# Patient Record
Sex: Male | Born: 1945 | Race: White | Hispanic: No | Smoking: Current every day smoker
Health system: Southern US, Community
[De-identification: ages and names within clinical notes are randomized; demographics above are authoritative.]

## PROBLEM LIST (undated history)

## (undated) DIAGNOSIS — I10 Essential (primary) hypertension: Secondary | ICD-10-CM

## (undated) DIAGNOSIS — J449 Chronic obstructive pulmonary disease, unspecified: Secondary | ICD-10-CM

## (undated) DIAGNOSIS — N529 Male erectile dysfunction, unspecified: Secondary | ICD-10-CM

## (undated) DIAGNOSIS — C61 Malignant neoplasm of prostate: Secondary | ICD-10-CM

## (undated) DIAGNOSIS — F329 Major depressive disorder, single episode, unspecified: Secondary | ICD-10-CM

## (undated) DIAGNOSIS — E119 Type 2 diabetes mellitus without complications: Secondary | ICD-10-CM

## (undated) DIAGNOSIS — F32A Depression, unspecified: Secondary | ICD-10-CM

## (undated) HISTORY — DX: Male erectile dysfunction, unspecified: N52.9

## (undated) HISTORY — DX: Major depressive disorder, single episode, unspecified: F32.9

## (undated) HISTORY — DX: Type 2 diabetes mellitus without complications: E11.9

## (undated) HISTORY — PX: BACK SURGERY: SHX140

## (undated) HISTORY — DX: Essential (primary) hypertension: I10

## (undated) HISTORY — PX: APPENDECTOMY: SHX54

## (undated) HISTORY — DX: Depression, unspecified: F32.A

## (undated) HISTORY — PX: COLONOSCOPY: SHX174

---

## 2008-10-10 ENCOUNTER — Encounter: Admission: RE | Admit: 2008-10-10 | Discharge: 2008-10-10 | Payer: Self-pay | Admitting: Family Medicine

## 2010-04-03 ENCOUNTER — Inpatient Hospital Stay (HOSPITAL_COMMUNITY)
Admission: EM | Admit: 2010-04-03 | Discharge: 2010-04-10 | Disposition: A | Discharge status: Rehabilitation Facility | Payer: Self-pay | Source: Home / Self Care | Admitting: Emergency Medicine

## 2010-04-05 ENCOUNTER — Ambulatory Visit: Payer: Self-pay | Admitting: Physical Medicine & Rehabilitation

## 2010-04-10 ENCOUNTER — Inpatient Hospital Stay (HOSPITAL_COMMUNITY)
Admission: RE | Admit: 2010-04-10 | Discharge: 2010-04-19 | Payer: Self-pay | Admitting: Physical Medicine & Rehabilitation

## 2010-06-06 ENCOUNTER — Encounter
Admission: RE | Admit: 2010-06-06 | Discharge: 2010-06-06 | Payer: Self-pay | Admitting: Physical Medicine & Rehabilitation

## 2010-07-12 ENCOUNTER — Encounter: Admission: RE | Admit: 2010-07-12 | Discharge: 2010-07-12 | Payer: Self-pay | Admitting: Neurological Surgery

## 2011-01-28 LAB — ABO/RH: ABO/RH(D): B NEG

## 2011-01-28 LAB — MRSA PCR SCREENING: MRSA by PCR: NEGATIVE

## 2011-01-28 LAB — DIFFERENTIAL
Basophils Absolute: 0 10*3/uL (ref 0.0–0.1)
Eosinophils Absolute: 0.2 10*3/uL (ref 0.0–0.7)
Eosinophils Relative: 2 % (ref 0–5)
Lymphocytes Relative: 9 % — ABNORMAL LOW (ref 12–46)
Lymphs Abs: 3 10*3/uL (ref 0.7–4.0)
Lymphs Abs: 1.3 10*3/uL (ref 0.7–4.0)
Monocytes Absolute: 0.4 10*3/uL (ref 0.1–1.0)
Monocytes Relative: 10 % (ref 3–12)
Monocytes Relative: 3 % (ref 3–12)
Neutro Abs: 12.8 10*3/uL — ABNORMAL HIGH (ref 1.7–7.7)
Neutrophils Relative %: 88 % — ABNORMAL HIGH (ref 43–77)

## 2011-01-28 LAB — GLUCOSE, CAPILLARY
Glucose-Capillary: 101 mg/dL — ABNORMAL HIGH (ref 70–99)
Glucose-Capillary: 107 mg/dL — ABNORMAL HIGH (ref 70–99)
Glucose-Capillary: 108 mg/dL — ABNORMAL HIGH (ref 70–99)
Glucose-Capillary: 109 mg/dL — ABNORMAL HIGH (ref 70–99)
Glucose-Capillary: 114 mg/dL — ABNORMAL HIGH (ref 70–99)
Glucose-Capillary: 117 mg/dL — ABNORMAL HIGH (ref 70–99)
Glucose-Capillary: 120 mg/dL — ABNORMAL HIGH (ref 70–99)
Glucose-Capillary: 123 mg/dL — ABNORMAL HIGH (ref 70–99)
Glucose-Capillary: 128 mg/dL — ABNORMAL HIGH (ref 70–99)
Glucose-Capillary: 129 mg/dL — ABNORMAL HIGH (ref 70–99)
Glucose-Capillary: 142 mg/dL — ABNORMAL HIGH (ref 70–99)
Glucose-Capillary: 145 mg/dL — ABNORMAL HIGH (ref 70–99)
Glucose-Capillary: 196 mg/dL — ABNORMAL HIGH (ref 70–99)
Glucose-Capillary: 87 mg/dL (ref 70–99)
Glucose-Capillary: 90 mg/dL (ref 70–99)
Glucose-Capillary: 90 mg/dL (ref 70–99)
Glucose-Capillary: 92 mg/dL (ref 70–99)
Glucose-Capillary: 96 mg/dL (ref 70–99)
Glucose-Capillary: 113 mg/dL — ABNORMAL HIGH (ref 70–99)
Glucose-Capillary: 115 mg/dL — ABNORMAL HIGH (ref 70–99)
Glucose-Capillary: 130 mg/dL — ABNORMAL HIGH (ref 70–99)
Glucose-Capillary: 135 mg/dL — ABNORMAL HIGH (ref 70–99)
Glucose-Capillary: 139 mg/dL — ABNORMAL HIGH (ref 70–99)
Glucose-Capillary: 143 mg/dL — ABNORMAL HIGH (ref 70–99)
Glucose-Capillary: 158 mg/dL — ABNORMAL HIGH (ref 70–99)

## 2011-01-28 LAB — URINE CULTURE
Colony Count: NO GROWTH
Culture: NO GROWTH

## 2011-01-28 LAB — POCT I-STAT, CHEM 8
Chloride: 106 mEq/L (ref 96–112)
Glucose, Bld: 123 mg/dL — ABNORMAL HIGH (ref 70–99)
HCT: 48 % (ref 39.0–52.0)
Hemoglobin: 16.3 g/dL (ref 13.0–17.0)
Potassium: 4.3 mEq/L (ref 3.5–5.1)
Sodium: 137 mEq/L (ref 135–145)

## 2011-01-28 LAB — URINALYSIS, ROUTINE W REFLEX MICROSCOPIC
Bilirubin Urine: NEGATIVE
Bilirubin Urine: NEGATIVE
Ketones, ur: NEGATIVE mg/dL
Leukocytes, UA: NEGATIVE
Nitrite: NEGATIVE
Nitrite: NEGATIVE
Specific Gravity, Urine: 1.015 (ref 1.005–1.030)
Specific Gravity, Urine: 1.017 (ref 1.005–1.030)
Urobilinogen, UA: 0.2 mg/dL (ref 0.0–1.0)

## 2011-01-28 LAB — URINE MICROSCOPIC-ADD ON

## 2011-01-28 LAB — CBC
HCT: 43.3 % (ref 39.0–52.0)
Hemoglobin: 15.6 g/dL (ref 13.0–17.0)
MCHC: 33.7 g/dL (ref 30.0–36.0)
RBC: 4.68 MIL/uL (ref 4.22–5.81)
RBC: 4.95 MIL/uL (ref 4.22–5.81)
RDW: 14.4 % (ref 11.5–15.5)
WBC: 12.5 10*3/uL — ABNORMAL HIGH (ref 4.0–10.5)
WBC: 14.6 10*3/uL — ABNORMAL HIGH (ref 4.0–10.5)

## 2011-01-28 LAB — COMPREHENSIVE METABOLIC PANEL
BUN: 29 mg/dL — ABNORMAL HIGH (ref 6–23)
CO2: 28 mEq/L (ref 19–32)
Calcium: 9.1 mg/dL (ref 8.4–10.5)
Chloride: 101 mEq/L (ref 96–112)
Sodium: 133 mEq/L — ABNORMAL LOW (ref 135–145)
Total Bilirubin: 0.8 mg/dL (ref 0.3–1.2)
Total Protein: 6.3 g/dL (ref 6.0–8.3)

## 2011-01-28 LAB — TYPE AND SCREEN: Antibody Screen: NEGATIVE

## 2012-05-29 DIAGNOSIS — N410 Acute prostatitis: Secondary | ICD-10-CM | POA: Diagnosis not present

## 2012-05-29 DIAGNOSIS — N3000 Acute cystitis without hematuria: Secondary | ICD-10-CM | POA: Diagnosis not present

## 2012-05-29 DIAGNOSIS — N138 Other obstructive and reflux uropathy: Secondary | ICD-10-CM | POA: Diagnosis not present

## 2012-05-29 DIAGNOSIS — N401 Enlarged prostate with lower urinary tract symptoms: Secondary | ICD-10-CM | POA: Diagnosis not present

## 2012-06-02 DIAGNOSIS — N138 Other obstructive and reflux uropathy: Secondary | ICD-10-CM | POA: Diagnosis not present

## 2012-06-02 DIAGNOSIS — R339 Retention of urine, unspecified: Secondary | ICD-10-CM | POA: Diagnosis not present

## 2012-06-02 DIAGNOSIS — N401 Enlarged prostate with lower urinary tract symptoms: Secondary | ICD-10-CM | POA: Diagnosis not present

## 2012-06-29 DIAGNOSIS — E785 Hyperlipidemia, unspecified: Secondary | ICD-10-CM | POA: Diagnosis not present

## 2012-06-29 DIAGNOSIS — F329 Major depressive disorder, single episode, unspecified: Secondary | ICD-10-CM | POA: Diagnosis not present

## 2012-06-29 DIAGNOSIS — E119 Type 2 diabetes mellitus without complications: Secondary | ICD-10-CM | POA: Diagnosis not present

## 2012-06-29 DIAGNOSIS — J449 Chronic obstructive pulmonary disease, unspecified: Secondary | ICD-10-CM | POA: Diagnosis not present

## 2012-06-29 DIAGNOSIS — I10 Essential (primary) hypertension: Secondary | ICD-10-CM | POA: Diagnosis not present

## 2012-06-29 DIAGNOSIS — F3289 Other specified depressive episodes: Secondary | ICD-10-CM | POA: Diagnosis not present

## 2012-07-23 ENCOUNTER — Other Ambulatory Visit: Payer: Self-pay | Admitting: Family Medicine

## 2012-07-23 ENCOUNTER — Ambulatory Visit
Admission: RE | Admit: 2012-07-23 | Discharge: 2012-07-23 | Disposition: A | Discharge status: Home / Self Care | Payer: Medicare Other | Source: Ambulatory Visit | Attending: Family Medicine | Admitting: Family Medicine

## 2012-07-23 DIAGNOSIS — G5600 Carpal tunnel syndrome, unspecified upper limb: Secondary | ICD-10-CM | POA: Diagnosis not present

## 2012-07-23 DIAGNOSIS — M25569 Pain in unspecified knee: Secondary | ICD-10-CM

## 2012-07-23 DIAGNOSIS — I10 Essential (primary) hypertension: Secondary | ICD-10-CM | POA: Diagnosis not present

## 2012-07-23 DIAGNOSIS — Z23 Encounter for immunization: Secondary | ICD-10-CM | POA: Diagnosis not present

## 2012-08-26 DIAGNOSIS — N401 Enlarged prostate with lower urinary tract symptoms: Secondary | ICD-10-CM | POA: Diagnosis not present

## 2012-08-26 DIAGNOSIS — N138 Other obstructive and reflux uropathy: Secondary | ICD-10-CM | POA: Diagnosis not present

## 2012-09-02 DIAGNOSIS — N400 Enlarged prostate without lower urinary tract symptoms: Secondary | ICD-10-CM | POA: Diagnosis not present

## 2012-09-05 DIAGNOSIS — S63509A Unspecified sprain of unspecified wrist, initial encounter: Secondary | ICD-10-CM | POA: Diagnosis not present

## 2012-09-05 DIAGNOSIS — M79609 Pain in unspecified limb: Secondary | ICD-10-CM | POA: Diagnosis not present

## 2012-09-08 DIAGNOSIS — Z0271 Encounter for disability determination: Secondary | ICD-10-CM | POA: Diagnosis not present

## 2012-09-08 DIAGNOSIS — I10 Essential (primary) hypertension: Secondary | ICD-10-CM | POA: Diagnosis not present

## 2012-09-14 DIAGNOSIS — Z23 Encounter for immunization: Secondary | ICD-10-CM | POA: Diagnosis not present

## 2012-09-14 DIAGNOSIS — Z136 Encounter for screening for cardiovascular disorders: Secondary | ICD-10-CM | POA: Diagnosis not present

## 2012-09-14 DIAGNOSIS — Z0000 Encounter for general adult medical examination without abnormal findings: Secondary | ICD-10-CM | POA: Diagnosis not present

## 2012-09-14 DIAGNOSIS — Z131 Encounter for screening for diabetes mellitus: Secondary | ICD-10-CM | POA: Diagnosis not present

## 2012-10-30 DIAGNOSIS — Z87891 Personal history of nicotine dependence: Secondary | ICD-10-CM | POA: Diagnosis not present

## 2013-01-18 DIAGNOSIS — R252 Cramp and spasm: Secondary | ICD-10-CM | POA: Diagnosis not present

## 2013-01-18 DIAGNOSIS — F329 Major depressive disorder, single episode, unspecified: Secondary | ICD-10-CM | POA: Diagnosis not present

## 2013-01-18 DIAGNOSIS — F3289 Other specified depressive episodes: Secondary | ICD-10-CM | POA: Diagnosis not present

## 2013-01-18 DIAGNOSIS — E119 Type 2 diabetes mellitus without complications: Secondary | ICD-10-CM | POA: Diagnosis not present

## 2013-01-18 DIAGNOSIS — I10 Essential (primary) hypertension: Secondary | ICD-10-CM | POA: Diagnosis not present

## 2013-01-18 DIAGNOSIS — J069 Acute upper respiratory infection, unspecified: Secondary | ICD-10-CM | POA: Diagnosis not present

## 2013-01-18 DIAGNOSIS — J449 Chronic obstructive pulmonary disease, unspecified: Secondary | ICD-10-CM | POA: Diagnosis not present

## 2013-02-01 DIAGNOSIS — E119 Type 2 diabetes mellitus without complications: Secondary | ICD-10-CM | POA: Diagnosis not present

## 2013-03-23 IMAGING — CR DG KNEE 1-2V*R*
2 series · 2 of 2 positions shown · non-contrast
Comparison: None.

CLINICAL DATA: Aching

RIGHT KNEE - 1-2 VIEW

[view not recorded (1 of 2)]
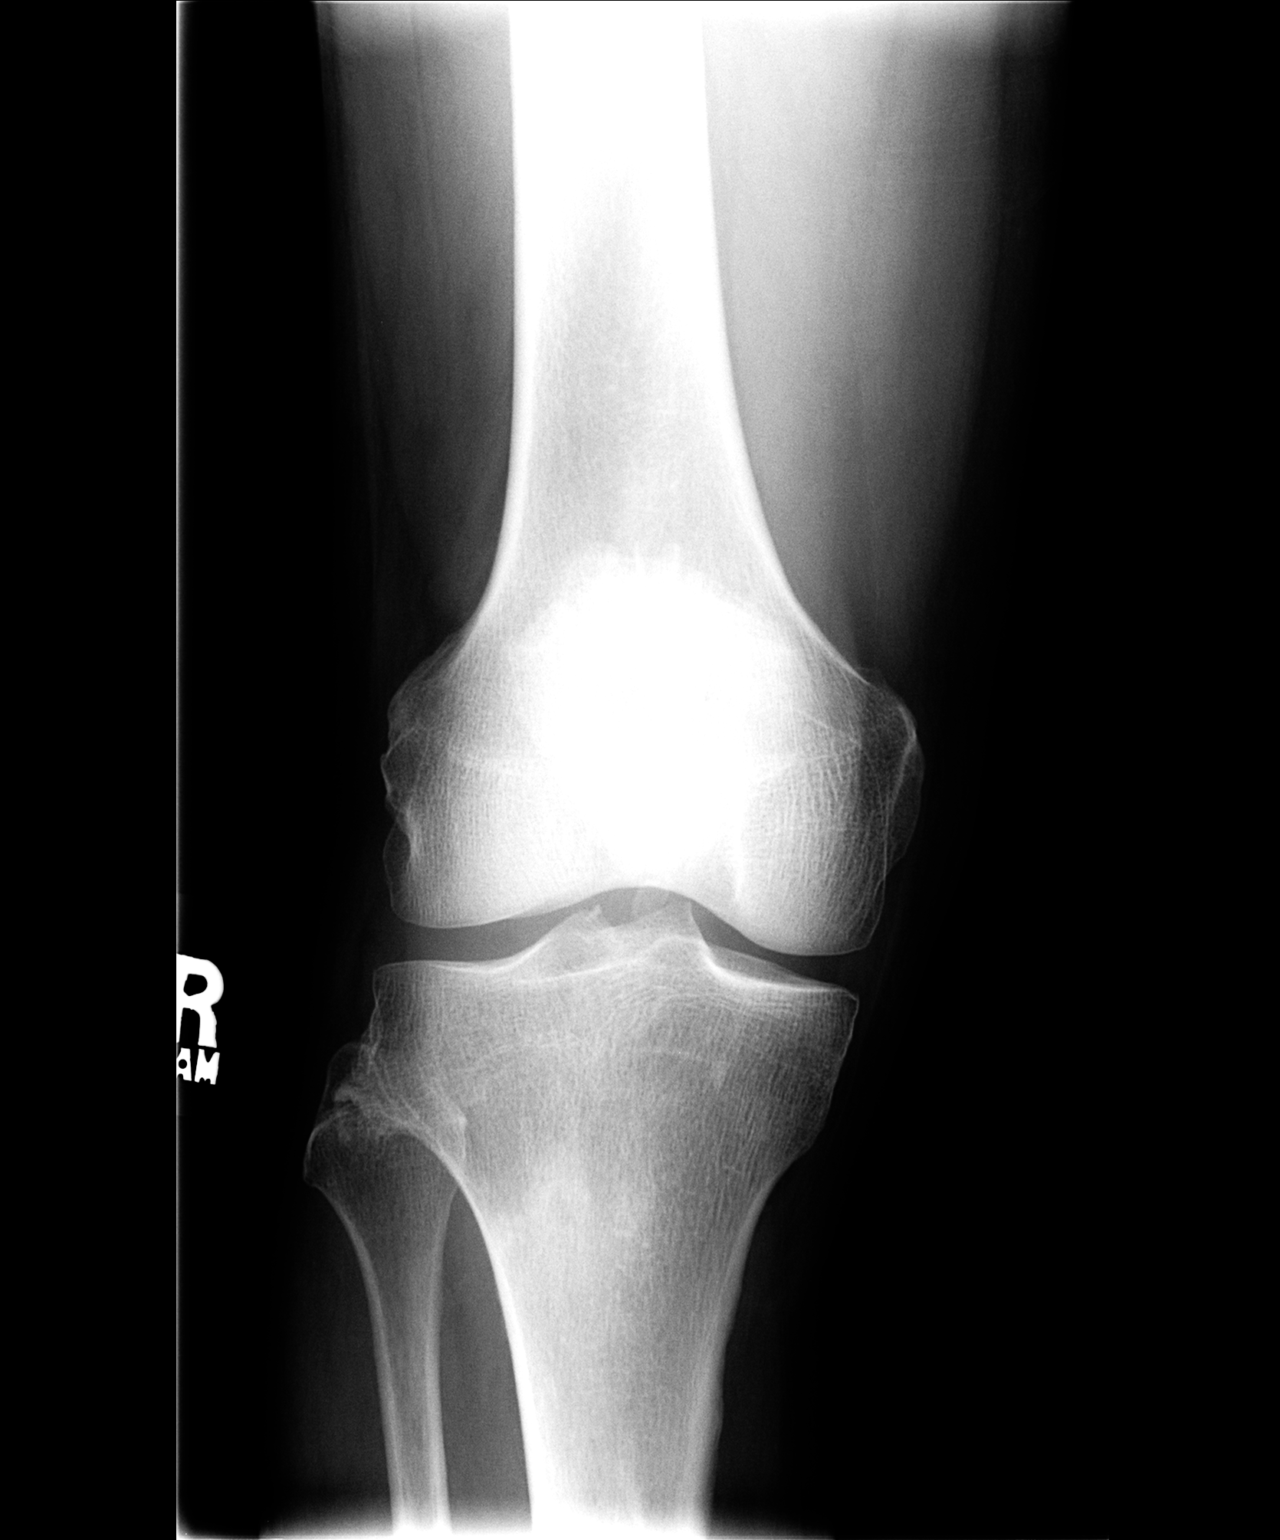

[view not recorded (2 of 2)]
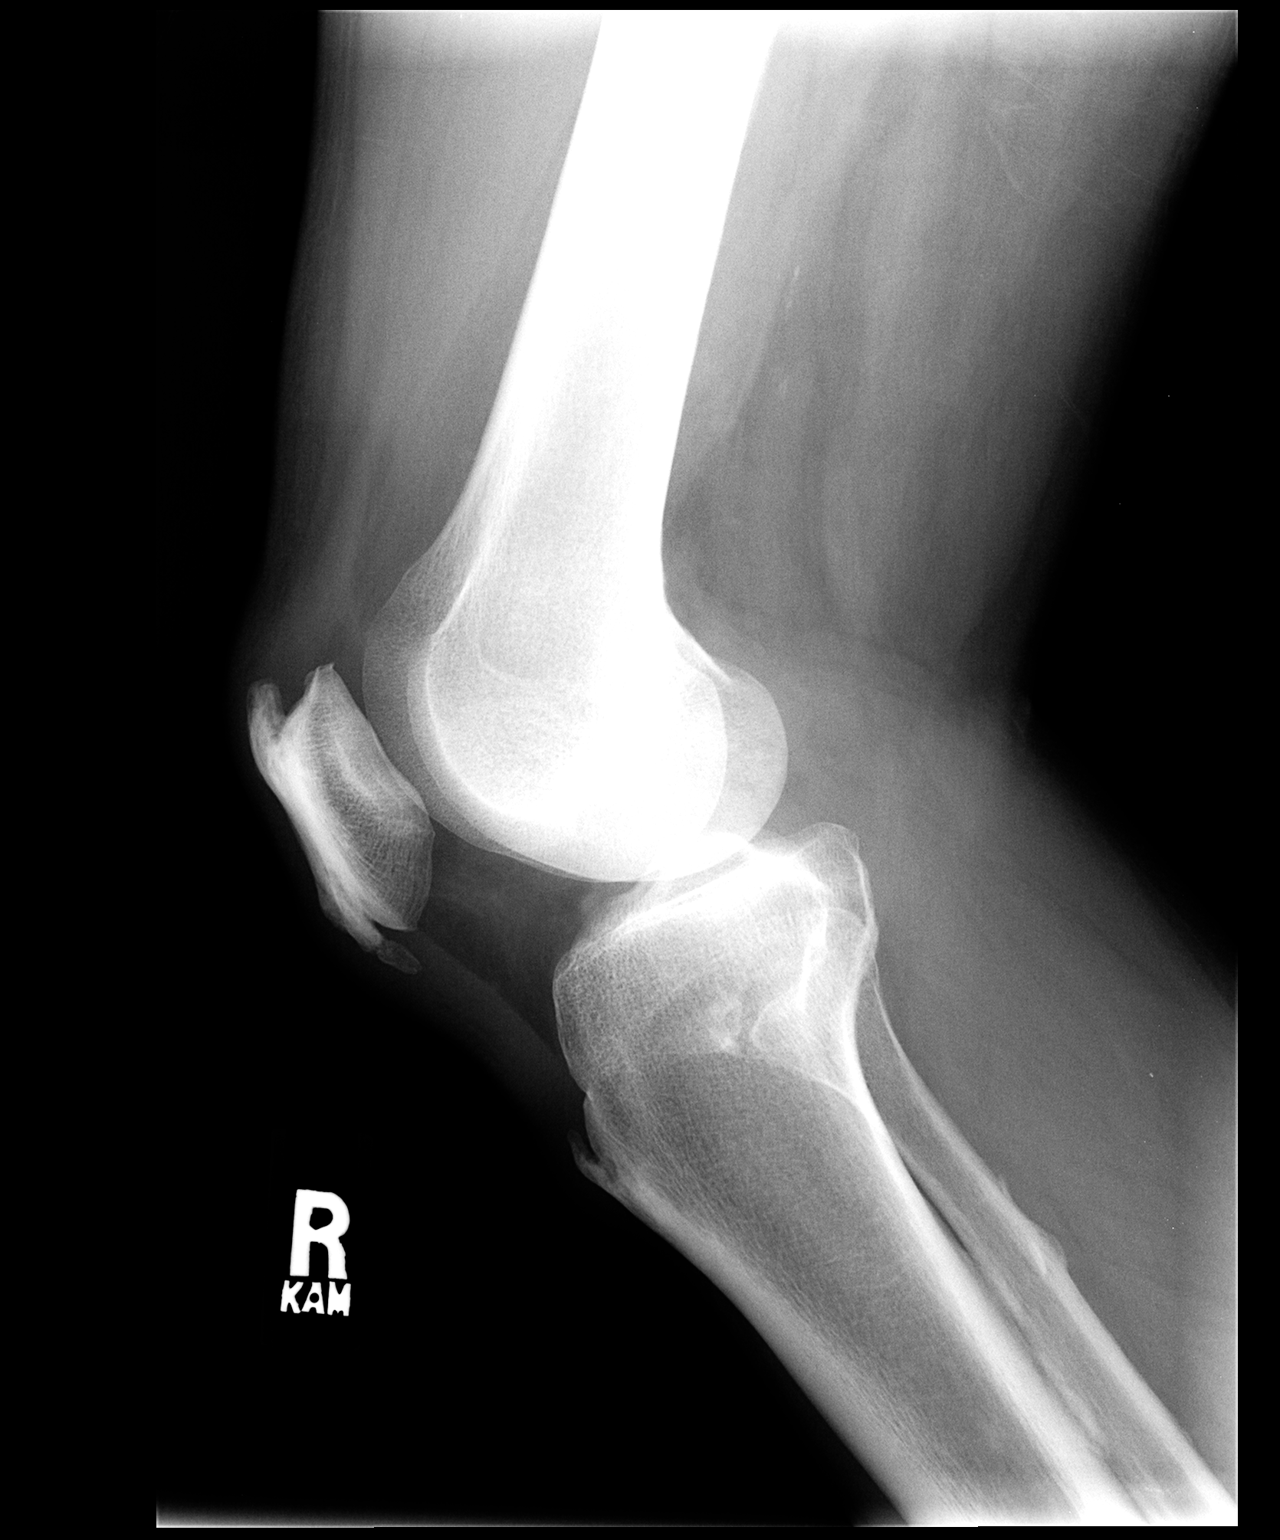

[2 of 2 positions shown; findings below may reference images not displayed]

FINDINGS: Prominent traction spurs from the patella and at the
insertion of the patellar ligament.  No effusion. Negative for
fracture, dislocation, or other acute abnormality.  Normal
alignment and mineralization. No significant degenerative change.
Regional soft tissues unremarkable.
IMPRESSION: Negative

## 2013-04-09 DIAGNOSIS — E119 Type 2 diabetes mellitus without complications: Secondary | ICD-10-CM | POA: Diagnosis not present

## 2013-05-03 DIAGNOSIS — R310 Gross hematuria: Secondary | ICD-10-CM | POA: Diagnosis not present

## 2013-05-11 DIAGNOSIS — R310 Gross hematuria: Secondary | ICD-10-CM | POA: Diagnosis not present

## 2013-05-11 DIAGNOSIS — N400 Enlarged prostate without lower urinary tract symptoms: Secondary | ICD-10-CM | POA: Diagnosis not present

## 2013-05-11 DIAGNOSIS — N320 Bladder-neck obstruction: Secondary | ICD-10-CM | POA: Diagnosis not present

## 2013-05-26 DIAGNOSIS — N400 Enlarged prostate without lower urinary tract symptoms: Secondary | ICD-10-CM | POA: Diagnosis not present

## 2013-05-26 DIAGNOSIS — R310 Gross hematuria: Secondary | ICD-10-CM | POA: Diagnosis not present

## 2013-07-21 DIAGNOSIS — E1159 Type 2 diabetes mellitus with other circulatory complications: Secondary | ICD-10-CM | POA: Diagnosis not present

## 2013-07-21 DIAGNOSIS — F329 Major depressive disorder, single episode, unspecified: Secondary | ICD-10-CM | POA: Diagnosis not present

## 2013-07-21 DIAGNOSIS — Z1211 Encounter for screening for malignant neoplasm of colon: Secondary | ICD-10-CM | POA: Diagnosis not present

## 2013-07-21 DIAGNOSIS — E785 Hyperlipidemia, unspecified: Secondary | ICD-10-CM | POA: Diagnosis not present

## 2013-07-21 DIAGNOSIS — I739 Peripheral vascular disease, unspecified: Secondary | ICD-10-CM | POA: Diagnosis not present

## 2013-07-21 DIAGNOSIS — F17200 Nicotine dependence, unspecified, uncomplicated: Secondary | ICD-10-CM | POA: Diagnosis not present

## 2013-07-21 DIAGNOSIS — I10 Essential (primary) hypertension: Secondary | ICD-10-CM | POA: Diagnosis not present

## 2013-08-26 DIAGNOSIS — N400 Enlarged prostate without lower urinary tract symptoms: Secondary | ICD-10-CM | POA: Diagnosis not present

## 2013-09-09 DIAGNOSIS — Z23 Encounter for immunization: Secondary | ICD-10-CM | POA: Diagnosis not present

## 2013-10-25 DIAGNOSIS — F329 Major depressive disorder, single episode, unspecified: Secondary | ICD-10-CM | POA: Diagnosis not present

## 2014-01-12 DIAGNOSIS — Z23 Encounter for immunization: Secondary | ICD-10-CM | POA: Diagnosis not present

## 2014-01-12 DIAGNOSIS — E1159 Type 2 diabetes mellitus with other circulatory complications: Secondary | ICD-10-CM | POA: Diagnosis not present

## 2014-01-12 DIAGNOSIS — I10 Essential (primary) hypertension: Secondary | ICD-10-CM | POA: Diagnosis not present

## 2014-01-12 DIAGNOSIS — E785 Hyperlipidemia, unspecified: Secondary | ICD-10-CM | POA: Diagnosis not present

## 2014-01-12 DIAGNOSIS — I739 Peripheral vascular disease, unspecified: Secondary | ICD-10-CM | POA: Diagnosis not present

## 2014-01-12 DIAGNOSIS — J449 Chronic obstructive pulmonary disease, unspecified: Secondary | ICD-10-CM | POA: Diagnosis not present

## 2014-01-12 DIAGNOSIS — Z0000 Encounter for general adult medical examination without abnormal findings: Secondary | ICD-10-CM | POA: Diagnosis not present

## 2014-07-14 DIAGNOSIS — N139 Obstructive and reflux uropathy, unspecified: Secondary | ICD-10-CM | POA: Diagnosis not present

## 2014-07-14 DIAGNOSIS — R310 Gross hematuria: Secondary | ICD-10-CM | POA: Diagnosis not present

## 2014-07-14 DIAGNOSIS — N401 Enlarged prostate with lower urinary tract symptoms: Secondary | ICD-10-CM | POA: Diagnosis not present

## 2014-07-14 DIAGNOSIS — N138 Other obstructive and reflux uropathy: Secondary | ICD-10-CM | POA: Diagnosis not present

## 2014-07-19 DIAGNOSIS — I739 Peripheral vascular disease, unspecified: Secondary | ICD-10-CM | POA: Diagnosis not present

## 2014-07-19 DIAGNOSIS — J449 Chronic obstructive pulmonary disease, unspecified: Secondary | ICD-10-CM | POA: Diagnosis not present

## 2014-07-19 DIAGNOSIS — F17200 Nicotine dependence, unspecified, uncomplicated: Secondary | ICD-10-CM | POA: Diagnosis not present

## 2014-07-19 DIAGNOSIS — E785 Hyperlipidemia, unspecified: Secondary | ICD-10-CM | POA: Diagnosis not present

## 2014-07-19 DIAGNOSIS — Z23 Encounter for immunization: Secondary | ICD-10-CM | POA: Diagnosis not present

## 2014-07-19 DIAGNOSIS — I10 Essential (primary) hypertension: Secondary | ICD-10-CM | POA: Diagnosis not present

## 2014-07-19 DIAGNOSIS — E1159 Type 2 diabetes mellitus with other circulatory complications: Secondary | ICD-10-CM | POA: Diagnosis not present

## 2014-09-22 DIAGNOSIS — R202 Paresthesia of skin: Secondary | ICD-10-CM | POA: Diagnosis not present

## 2015-01-17 DIAGNOSIS — M25552 Pain in left hip: Secondary | ICD-10-CM | POA: Diagnosis not present

## 2015-01-17 DIAGNOSIS — J449 Chronic obstructive pulmonary disease, unspecified: Secondary | ICD-10-CM | POA: Diagnosis not present

## 2015-01-17 DIAGNOSIS — I739 Peripheral vascular disease, unspecified: Secondary | ICD-10-CM | POA: Diagnosis not present

## 2015-01-17 DIAGNOSIS — E1151 Type 2 diabetes mellitus with diabetic peripheral angiopathy without gangrene: Secondary | ICD-10-CM | POA: Diagnosis not present

## 2015-01-17 DIAGNOSIS — I10 Essential (primary) hypertension: Secondary | ICD-10-CM | POA: Diagnosis not present

## 2015-01-17 DIAGNOSIS — Z79899 Other long term (current) drug therapy: Secondary | ICD-10-CM | POA: Diagnosis not present

## 2015-01-17 DIAGNOSIS — E785 Hyperlipidemia, unspecified: Secondary | ICD-10-CM | POA: Diagnosis not present

## 2015-01-17 DIAGNOSIS — Z0001 Encounter for general adult medical examination with abnormal findings: Secondary | ICD-10-CM | POA: Diagnosis not present

## 2015-01-23 DIAGNOSIS — Z1211 Encounter for screening for malignant neoplasm of colon: Secondary | ICD-10-CM | POA: Diagnosis not present

## 2015-01-30 DIAGNOSIS — M25552 Pain in left hip: Secondary | ICD-10-CM | POA: Diagnosis not present

## 2015-01-30 DIAGNOSIS — M7062 Trochanteric bursitis, left hip: Secondary | ICD-10-CM | POA: Diagnosis not present

## 2015-03-13 DIAGNOSIS — M7062 Trochanteric bursitis, left hip: Secondary | ICD-10-CM | POA: Diagnosis not present

## 2015-03-23 DIAGNOSIS — M7062 Trochanteric bursitis, left hip: Secondary | ICD-10-CM | POA: Diagnosis not present

## 2015-03-28 DIAGNOSIS — M7062 Trochanteric bursitis, left hip: Secondary | ICD-10-CM | POA: Diagnosis not present

## 2015-03-30 DIAGNOSIS — M7062 Trochanteric bursitis, left hip: Secondary | ICD-10-CM | POA: Diagnosis not present

## 2015-04-04 DIAGNOSIS — M7062 Trochanteric bursitis, left hip: Secondary | ICD-10-CM | POA: Diagnosis not present

## 2015-04-06 DIAGNOSIS — M7062 Trochanteric bursitis, left hip: Secondary | ICD-10-CM | POA: Diagnosis not present

## 2015-04-11 DIAGNOSIS — M7062 Trochanteric bursitis, left hip: Secondary | ICD-10-CM | POA: Diagnosis not present

## 2015-04-13 DIAGNOSIS — M7062 Trochanteric bursitis, left hip: Secondary | ICD-10-CM | POA: Diagnosis not present

## 2015-04-18 DIAGNOSIS — M7062 Trochanteric bursitis, left hip: Secondary | ICD-10-CM | POA: Diagnosis not present

## 2015-04-24 DIAGNOSIS — M7062 Trochanteric bursitis, left hip: Secondary | ICD-10-CM | POA: Diagnosis not present

## 2015-07-19 DIAGNOSIS — N400 Enlarged prostate without lower urinary tract symptoms: Secondary | ICD-10-CM | POA: Diagnosis not present

## 2015-07-19 DIAGNOSIS — R972 Elevated prostate specific antigen [PSA]: Secondary | ICD-10-CM | POA: Diagnosis not present

## 2015-07-21 DIAGNOSIS — E780 Pure hypercholesterolemia: Secondary | ICD-10-CM | POA: Diagnosis not present

## 2015-07-21 DIAGNOSIS — I739 Peripheral vascular disease, unspecified: Secondary | ICD-10-CM | POA: Diagnosis not present

## 2015-07-21 DIAGNOSIS — E1151 Type 2 diabetes mellitus with diabetic peripheral angiopathy without gangrene: Secondary | ICD-10-CM | POA: Diagnosis not present

## 2015-07-21 DIAGNOSIS — J449 Chronic obstructive pulmonary disease, unspecified: Secondary | ICD-10-CM | POA: Diagnosis not present

## 2015-07-21 DIAGNOSIS — E785 Hyperlipidemia, unspecified: Secondary | ICD-10-CM | POA: Diagnosis not present

## 2015-07-21 DIAGNOSIS — Z79899 Other long term (current) drug therapy: Secondary | ICD-10-CM | POA: Diagnosis not present

## 2016-01-23 DIAGNOSIS — Z7984 Long term (current) use of oral hypoglycemic drugs: Secondary | ICD-10-CM | POA: Diagnosis not present

## 2016-01-23 DIAGNOSIS — I10 Essential (primary) hypertension: Secondary | ICD-10-CM | POA: Diagnosis not present

## 2016-01-23 DIAGNOSIS — Z79899 Other long term (current) drug therapy: Secondary | ICD-10-CM | POA: Diagnosis not present

## 2016-01-23 DIAGNOSIS — J449 Chronic obstructive pulmonary disease, unspecified: Secondary | ICD-10-CM | POA: Diagnosis not present

## 2016-01-23 DIAGNOSIS — E1151 Type 2 diabetes mellitus with diabetic peripheral angiopathy without gangrene: Secondary | ICD-10-CM | POA: Diagnosis not present

## 2016-01-23 DIAGNOSIS — E7800 Pure hypercholesterolemia, unspecified: Secondary | ICD-10-CM | POA: Diagnosis not present

## 2016-01-23 DIAGNOSIS — I739 Peripheral vascular disease, unspecified: Secondary | ICD-10-CM | POA: Diagnosis not present

## 2016-01-23 DIAGNOSIS — Z0001 Encounter for general adult medical examination with abnormal findings: Secondary | ICD-10-CM | POA: Diagnosis not present

## 2016-01-31 DIAGNOSIS — Z1211 Encounter for screening for malignant neoplasm of colon: Secondary | ICD-10-CM | POA: Diagnosis not present

## 2016-07-29 DIAGNOSIS — E7800 Pure hypercholesterolemia, unspecified: Secondary | ICD-10-CM | POA: Diagnosis not present

## 2016-07-29 DIAGNOSIS — I10 Essential (primary) hypertension: Secondary | ICD-10-CM | POA: Diagnosis not present

## 2016-07-29 DIAGNOSIS — E1151 Type 2 diabetes mellitus with diabetic peripheral angiopathy without gangrene: Secondary | ICD-10-CM | POA: Diagnosis not present

## 2016-07-29 DIAGNOSIS — J449 Chronic obstructive pulmonary disease, unspecified: Secondary | ICD-10-CM | POA: Diagnosis not present

## 2016-07-29 DIAGNOSIS — Z23 Encounter for immunization: Secondary | ICD-10-CM | POA: Diagnosis not present

## 2016-07-29 DIAGNOSIS — I739 Peripheral vascular disease, unspecified: Secondary | ICD-10-CM | POA: Diagnosis not present

## 2016-07-29 DIAGNOSIS — Z79899 Other long term (current) drug therapy: Secondary | ICD-10-CM | POA: Diagnosis not present

## 2016-07-29 DIAGNOSIS — Z7984 Long term (current) use of oral hypoglycemic drugs: Secondary | ICD-10-CM | POA: Diagnosis not present

## 2016-08-20 DIAGNOSIS — R972 Elevated prostate specific antigen [PSA]: Secondary | ICD-10-CM | POA: Diagnosis not present

## 2016-08-26 DIAGNOSIS — N400 Enlarged prostate without lower urinary tract symptoms: Secondary | ICD-10-CM | POA: Diagnosis not present

## 2017-01-02 DIAGNOSIS — H35033 Hypertensive retinopathy, bilateral: Secondary | ICD-10-CM | POA: Diagnosis not present

## 2017-01-24 DIAGNOSIS — Z0001 Encounter for general adult medical examination with abnormal findings: Secondary | ICD-10-CM | POA: Diagnosis not present

## 2017-01-24 DIAGNOSIS — Z7984 Long term (current) use of oral hypoglycemic drugs: Secondary | ICD-10-CM | POA: Diagnosis not present

## 2017-01-24 DIAGNOSIS — Z720 Tobacco use: Secondary | ICD-10-CM | POA: Diagnosis not present

## 2017-01-24 DIAGNOSIS — E1151 Type 2 diabetes mellitus with diabetic peripheral angiopathy without gangrene: Secondary | ICD-10-CM | POA: Diagnosis not present

## 2017-01-24 DIAGNOSIS — I739 Peripheral vascular disease, unspecified: Secondary | ICD-10-CM | POA: Diagnosis not present

## 2017-01-24 DIAGNOSIS — E7800 Pure hypercholesterolemia, unspecified: Secondary | ICD-10-CM | POA: Diagnosis not present

## 2017-01-24 DIAGNOSIS — J449 Chronic obstructive pulmonary disease, unspecified: Secondary | ICD-10-CM | POA: Diagnosis not present

## 2017-01-24 DIAGNOSIS — Z79899 Other long term (current) drug therapy: Secondary | ICD-10-CM | POA: Diagnosis not present

## 2017-02-03 DIAGNOSIS — Z1211 Encounter for screening for malignant neoplasm of colon: Secondary | ICD-10-CM | POA: Diagnosis not present

## 2017-07-25 DIAGNOSIS — I739 Peripheral vascular disease, unspecified: Secondary | ICD-10-CM | POA: Diagnosis not present

## 2017-07-25 DIAGNOSIS — E1151 Type 2 diabetes mellitus with diabetic peripheral angiopathy without gangrene: Secondary | ICD-10-CM | POA: Diagnosis not present

## 2017-07-25 DIAGNOSIS — J449 Chronic obstructive pulmonary disease, unspecified: Secondary | ICD-10-CM | POA: Diagnosis not present

## 2017-07-25 DIAGNOSIS — I10 Essential (primary) hypertension: Secondary | ICD-10-CM | POA: Diagnosis not present

## 2017-07-25 DIAGNOSIS — Z23 Encounter for immunization: Secondary | ICD-10-CM | POA: Diagnosis not present

## 2017-07-25 DIAGNOSIS — Z79899 Other long term (current) drug therapy: Secondary | ICD-10-CM | POA: Diagnosis not present

## 2017-07-25 DIAGNOSIS — E7800 Pure hypercholesterolemia, unspecified: Secondary | ICD-10-CM | POA: Diagnosis not present

## 2017-09-26 DIAGNOSIS — R310 Gross hematuria: Secondary | ICD-10-CM | POA: Diagnosis not present

## 2017-10-01 DIAGNOSIS — R310 Gross hematuria: Secondary | ICD-10-CM | POA: Diagnosis not present

## 2017-10-06 DIAGNOSIS — Z85828 Personal history of other malignant neoplasm of skin: Secondary | ICD-10-CM | POA: Diagnosis not present

## 2017-10-06 DIAGNOSIS — C44629 Squamous cell carcinoma of skin of left upper limb, including shoulder: Secondary | ICD-10-CM | POA: Diagnosis not present

## 2017-10-15 DIAGNOSIS — N401 Enlarged prostate with lower urinary tract symptoms: Secondary | ICD-10-CM | POA: Diagnosis not present

## 2017-10-15 DIAGNOSIS — R3912 Poor urinary stream: Secondary | ICD-10-CM | POA: Diagnosis not present

## 2017-10-15 DIAGNOSIS — R310 Gross hematuria: Secondary | ICD-10-CM | POA: Diagnosis not present

## 2017-12-03 DIAGNOSIS — N400 Enlarged prostate without lower urinary tract symptoms: Secondary | ICD-10-CM | POA: Diagnosis not present

## 2017-12-03 DIAGNOSIS — R310 Gross hematuria: Secondary | ICD-10-CM | POA: Diagnosis not present

## 2018-01-23 DIAGNOSIS — Z79899 Other long term (current) drug therapy: Secondary | ICD-10-CM | POA: Diagnosis not present

## 2018-01-23 DIAGNOSIS — I10 Essential (primary) hypertension: Secondary | ICD-10-CM | POA: Diagnosis not present

## 2018-01-23 DIAGNOSIS — Z7984 Long term (current) use of oral hypoglycemic drugs: Secondary | ICD-10-CM | POA: Diagnosis not present

## 2018-01-23 DIAGNOSIS — Z1211 Encounter for screening for malignant neoplasm of colon: Secondary | ICD-10-CM | POA: Diagnosis not present

## 2018-01-23 DIAGNOSIS — I739 Peripheral vascular disease, unspecified: Secondary | ICD-10-CM | POA: Diagnosis not present

## 2018-01-23 DIAGNOSIS — J449 Chronic obstructive pulmonary disease, unspecified: Secondary | ICD-10-CM | POA: Diagnosis not present

## 2018-01-23 DIAGNOSIS — E7800 Pure hypercholesterolemia, unspecified: Secondary | ICD-10-CM | POA: Diagnosis not present

## 2018-01-23 DIAGNOSIS — E1151 Type 2 diabetes mellitus with diabetic peripheral angiopathy without gangrene: Secondary | ICD-10-CM | POA: Diagnosis not present

## 2018-01-24 DIAGNOSIS — Z1211 Encounter for screening for malignant neoplasm of colon: Secondary | ICD-10-CM | POA: Diagnosis not present

## 2018-03-18 DIAGNOSIS — W57XXXA Bitten or stung by nonvenomous insect and other nonvenomous arthropods, initial encounter: Secondary | ICD-10-CM | POA: Diagnosis not present

## 2018-03-18 DIAGNOSIS — S41109A Unspecified open wound of unspecified upper arm, initial encounter: Secondary | ICD-10-CM | POA: Diagnosis not present

## 2018-08-07 DIAGNOSIS — I739 Peripheral vascular disease, unspecified: Secondary | ICD-10-CM | POA: Diagnosis not present

## 2018-08-07 DIAGNOSIS — J449 Chronic obstructive pulmonary disease, unspecified: Secondary | ICD-10-CM | POA: Diagnosis not present

## 2018-08-07 DIAGNOSIS — E7800 Pure hypercholesterolemia, unspecified: Secondary | ICD-10-CM | POA: Diagnosis not present

## 2018-08-07 DIAGNOSIS — E1151 Type 2 diabetes mellitus with diabetic peripheral angiopathy without gangrene: Secondary | ICD-10-CM | POA: Diagnosis not present

## 2018-08-07 DIAGNOSIS — I10 Essential (primary) hypertension: Secondary | ICD-10-CM | POA: Diagnosis not present

## 2018-08-07 DIAGNOSIS — Z79899 Other long term (current) drug therapy: Secondary | ICD-10-CM | POA: Diagnosis not present

## 2018-08-07 DIAGNOSIS — Z0001 Encounter for general adult medical examination with abnormal findings: Secondary | ICD-10-CM | POA: Diagnosis not present

## 2018-08-07 DIAGNOSIS — Z23 Encounter for immunization: Secondary | ICD-10-CM | POA: Diagnosis not present

## 2018-11-23 DIAGNOSIS — R197 Diarrhea, unspecified: Secondary | ICD-10-CM | POA: Diagnosis not present

## 2018-11-23 DIAGNOSIS — R140 Abdominal distension (gaseous): Secondary | ICD-10-CM | POA: Diagnosis not present

## 2018-11-27 DIAGNOSIS — H00025 Hordeolum internum left lower eyelid: Secondary | ICD-10-CM | POA: Diagnosis not present

## 2018-12-21 DIAGNOSIS — D125 Benign neoplasm of sigmoid colon: Secondary | ICD-10-CM | POA: Diagnosis not present

## 2018-12-21 DIAGNOSIS — D122 Benign neoplasm of ascending colon: Secondary | ICD-10-CM | POA: Diagnosis not present

## 2018-12-21 DIAGNOSIS — D123 Benign neoplasm of transverse colon: Secondary | ICD-10-CM | POA: Diagnosis not present

## 2018-12-21 DIAGNOSIS — K635 Polyp of colon: Secondary | ICD-10-CM | POA: Diagnosis not present

## 2018-12-21 DIAGNOSIS — R197 Diarrhea, unspecified: Secondary | ICD-10-CM | POA: Diagnosis not present

## 2018-12-21 DIAGNOSIS — D120 Benign neoplasm of cecum: Secondary | ICD-10-CM | POA: Diagnosis not present

## 2018-12-21 DIAGNOSIS — D124 Benign neoplasm of descending colon: Secondary | ICD-10-CM | POA: Diagnosis not present

## 2018-12-23 DIAGNOSIS — K635 Polyp of colon: Secondary | ICD-10-CM | POA: Diagnosis not present

## 2018-12-23 DIAGNOSIS — D122 Benign neoplasm of ascending colon: Secondary | ICD-10-CM | POA: Diagnosis not present

## 2018-12-23 DIAGNOSIS — D120 Benign neoplasm of cecum: Secondary | ICD-10-CM | POA: Diagnosis not present

## 2018-12-23 DIAGNOSIS — D125 Benign neoplasm of sigmoid colon: Secondary | ICD-10-CM | POA: Diagnosis not present

## 2018-12-23 DIAGNOSIS — D124 Benign neoplasm of descending colon: Secondary | ICD-10-CM | POA: Diagnosis not present

## 2018-12-23 DIAGNOSIS — D123 Benign neoplasm of transverse colon: Secondary | ICD-10-CM | POA: Diagnosis not present

## 2019-02-16 DIAGNOSIS — J449 Chronic obstructive pulmonary disease, unspecified: Secondary | ICD-10-CM | POA: Diagnosis not present

## 2019-02-16 DIAGNOSIS — E7800 Pure hypercholesterolemia, unspecified: Secondary | ICD-10-CM | POA: Diagnosis not present

## 2019-02-16 DIAGNOSIS — Z79899 Other long term (current) drug therapy: Secondary | ICD-10-CM | POA: Diagnosis not present

## 2019-02-16 DIAGNOSIS — E1151 Type 2 diabetes mellitus with diabetic peripheral angiopathy without gangrene: Secondary | ICD-10-CM | POA: Diagnosis not present

## 2019-02-16 DIAGNOSIS — I10 Essential (primary) hypertension: Secondary | ICD-10-CM | POA: Diagnosis not present

## 2019-02-16 DIAGNOSIS — I739 Peripheral vascular disease, unspecified: Secondary | ICD-10-CM | POA: Diagnosis not present

## 2019-04-01 DIAGNOSIS — N400 Enlarged prostate without lower urinary tract symptoms: Secondary | ICD-10-CM | POA: Diagnosis not present

## 2019-04-08 DIAGNOSIS — N5201 Erectile dysfunction due to arterial insufficiency: Secondary | ICD-10-CM | POA: Diagnosis not present

## 2019-04-08 DIAGNOSIS — N401 Enlarged prostate with lower urinary tract symptoms: Secondary | ICD-10-CM | POA: Diagnosis not present

## 2019-04-08 DIAGNOSIS — R3912 Poor urinary stream: Secondary | ICD-10-CM | POA: Diagnosis not present

## 2019-08-27 DIAGNOSIS — I739 Peripheral vascular disease, unspecified: Secondary | ICD-10-CM | POA: Diagnosis not present

## 2019-08-27 DIAGNOSIS — Z7984 Long term (current) use of oral hypoglycemic drugs: Secondary | ICD-10-CM | POA: Diagnosis not present

## 2019-08-27 DIAGNOSIS — I10 Essential (primary) hypertension: Secondary | ICD-10-CM | POA: Diagnosis not present

## 2019-08-27 DIAGNOSIS — Z1211 Encounter for screening for malignant neoplasm of colon: Secondary | ICD-10-CM | POA: Diagnosis not present

## 2019-08-27 DIAGNOSIS — E7800 Pure hypercholesterolemia, unspecified: Secondary | ICD-10-CM | POA: Diagnosis not present

## 2019-08-27 DIAGNOSIS — Z79899 Other long term (current) drug therapy: Secondary | ICD-10-CM | POA: Diagnosis not present

## 2019-08-27 DIAGNOSIS — J449 Chronic obstructive pulmonary disease, unspecified: Secondary | ICD-10-CM | POA: Diagnosis not present

## 2019-08-27 DIAGNOSIS — Z0001 Encounter for general adult medical examination with abnormal findings: Secondary | ICD-10-CM | POA: Diagnosis not present

## 2019-08-27 DIAGNOSIS — E1151 Type 2 diabetes mellitus with diabetic peripheral angiopathy without gangrene: Secondary | ICD-10-CM | POA: Diagnosis not present

## 2019-08-28 DIAGNOSIS — Z1211 Encounter for screening for malignant neoplasm of colon: Secondary | ICD-10-CM | POA: Diagnosis not present

## 2020-02-28 DIAGNOSIS — E7800 Pure hypercholesterolemia, unspecified: Secondary | ICD-10-CM | POA: Diagnosis not present

## 2020-02-28 DIAGNOSIS — E1151 Type 2 diabetes mellitus with diabetic peripheral angiopathy without gangrene: Secondary | ICD-10-CM | POA: Diagnosis not present

## 2020-02-28 DIAGNOSIS — I739 Peripheral vascular disease, unspecified: Secondary | ICD-10-CM | POA: Diagnosis not present

## 2020-02-28 DIAGNOSIS — Z7984 Long term (current) use of oral hypoglycemic drugs: Secondary | ICD-10-CM | POA: Diagnosis not present

## 2020-02-28 DIAGNOSIS — I10 Essential (primary) hypertension: Secondary | ICD-10-CM | POA: Diagnosis not present

## 2020-02-28 DIAGNOSIS — Z79899 Other long term (current) drug therapy: Secondary | ICD-10-CM | POA: Diagnosis not present

## 2020-02-28 DIAGNOSIS — J449 Chronic obstructive pulmonary disease, unspecified: Secondary | ICD-10-CM | POA: Diagnosis not present

## 2020-03-01 DIAGNOSIS — Z03818 Encounter for observation for suspected exposure to other biological agents ruled out: Secondary | ICD-10-CM | POA: Diagnosis not present

## 2020-03-01 DIAGNOSIS — Z20828 Contact with and (suspected) exposure to other viral communicable diseases: Secondary | ICD-10-CM | POA: Diagnosis not present

## 2020-03-03 ENCOUNTER — Ambulatory Visit: Payer: Medicare Other | Attending: Internal Medicine

## 2020-03-03 DIAGNOSIS — Z23 Encounter for immunization: Secondary | ICD-10-CM

## 2020-03-03 NOTE — Progress Notes (Signed)
   Covid-19 Vaccination Clinic  Name:  OSIEL DEHAY    MRN: [id number] DOB: 1946/08/26  03/03/2020  Mr. Trom was observed post Covid-19 immunization for 15 minutes without incident. He was provided with Vaccine Information Sheet and instruction to access the V-Safe system.   Mr. Dennis was instructed to call 911 with any severe reactions post vaccine: Marland Kitchen Difficulty breathing  . Swelling of face and throat  . A fast heartbeat  . A bad rash all over body  . Dizziness and weakness   Immunizations Administered    Name Date Dose VIS Date Route   Pfizer COVID-19 Vaccine 03/03/2020  9:04 AM 0.3 mL 01/05/2019 Intramuscular   Manufacturer: [place]   Lot: [id number]   [place]: [id number]

## 2020-03-27 ENCOUNTER — Ambulatory Visit: Payer: Medicare Other | Attending: Internal Medicine

## 2020-03-27 DIAGNOSIS — Z23 Encounter for immunization: Secondary | ICD-10-CM

## 2020-03-27 NOTE — Progress Notes (Signed)
   Covid-19 Vaccination Clinic  Name:  DJIBRIL DINGLEDINE    MRN: [id number] DOB: 05-25-1946  03/27/2020  Mr. Moak was observed post Covid-19 immunization for 15 minutes without incident. He was provided with Vaccine Information Sheet and instruction to access the V-Safe system.   Mr. Denke was instructed to call 911 with any severe reactions post vaccine: Marland Kitchen Difficulty breathing  . Swelling of face and throat  . A fast heartbeat  . A bad rash all over body  . Dizziness and weakness   Immunizations Administered    Name Date Dose VIS Date Route   Pfizer COVID-19 Vaccine 03/27/2020  9:50 AM 0.3 mL 01/05/2019 Intramuscular   Manufacturer: [place]   Lot: [id number]   [place]: [id number]

## 2020-08-22 DIAGNOSIS — E119 Type 2 diabetes mellitus without complications: Secondary | ICD-10-CM | POA: Diagnosis not present

## 2020-08-28 DIAGNOSIS — E1151 Type 2 diabetes mellitus with diabetic peripheral angiopathy without gangrene: Secondary | ICD-10-CM | POA: Diagnosis not present

## 2020-08-28 DIAGNOSIS — Z0001 Encounter for general adult medical examination with abnormal findings: Secondary | ICD-10-CM | POA: Diagnosis not present

## 2020-08-28 DIAGNOSIS — J449 Chronic obstructive pulmonary disease, unspecified: Secondary | ICD-10-CM | POA: Diagnosis not present

## 2020-08-28 DIAGNOSIS — Z79899 Other long term (current) drug therapy: Secondary | ICD-10-CM | POA: Diagnosis not present

## 2020-08-28 DIAGNOSIS — I10 Essential (primary) hypertension: Secondary | ICD-10-CM | POA: Diagnosis not present

## 2020-08-28 DIAGNOSIS — Z23 Encounter for immunization: Secondary | ICD-10-CM | POA: Diagnosis not present

## 2020-08-28 DIAGNOSIS — I739 Peripheral vascular disease, unspecified: Secondary | ICD-10-CM | POA: Diagnosis not present

## 2020-08-28 DIAGNOSIS — E7800 Pure hypercholesterolemia, unspecified: Secondary | ICD-10-CM | POA: Diagnosis not present

## 2020-09-21 DIAGNOSIS — Z79899 Other long term (current) drug therapy: Secondary | ICD-10-CM | POA: Diagnosis not present

## 2020-09-22 DIAGNOSIS — R0602 Shortness of breath: Secondary | ICD-10-CM | POA: Diagnosis not present

## 2020-09-22 DIAGNOSIS — I10 Essential (primary) hypertension: Secondary | ICD-10-CM | POA: Diagnosis not present

## 2020-09-25 ENCOUNTER — Other Ambulatory Visit: Payer: Self-pay | Admitting: Family Medicine

## 2020-09-25 ENCOUNTER — Ambulatory Visit
Admission: RE | Admit: 2020-09-25 | Discharge: 2020-09-25 | Disposition: A | Discharge status: Home / Self Care | Payer: Medicare Other | Source: Ambulatory Visit | Attending: Family Medicine | Admitting: Family Medicine

## 2020-09-25 DIAGNOSIS — R0602 Shortness of breath: Secondary | ICD-10-CM

## 2020-09-25 DIAGNOSIS — J449 Chronic obstructive pulmonary disease, unspecified: Secondary | ICD-10-CM | POA: Diagnosis not present

## 2020-09-29 DIAGNOSIS — I10 Essential (primary) hypertension: Secondary | ICD-10-CM | POA: Diagnosis not present

## 2020-09-29 DIAGNOSIS — E7800 Pure hypercholesterolemia, unspecified: Secondary | ICD-10-CM | POA: Diagnosis not present

## 2020-09-29 DIAGNOSIS — Z79899 Other long term (current) drug therapy: Secondary | ICD-10-CM | POA: Diagnosis not present

## 2020-10-12 DIAGNOSIS — Z79899 Other long term (current) drug therapy: Secondary | ICD-10-CM | POA: Diagnosis not present

## 2021-02-21 DIAGNOSIS — H353131 Nonexudative age-related macular degeneration, bilateral, early dry stage: Secondary | ICD-10-CM | POA: Diagnosis not present

## 2021-05-04 DIAGNOSIS — Z86010 Personal history of colonic polyps: Secondary | ICD-10-CM | POA: Diagnosis not present

## 2021-05-04 DIAGNOSIS — R140 Abdominal distension (gaseous): Secondary | ICD-10-CM | POA: Diagnosis not present

## 2021-05-04 DIAGNOSIS — R197 Diarrhea, unspecified: Secondary | ICD-10-CM | POA: Diagnosis not present

## 2021-05-23 DIAGNOSIS — R3912 Poor urinary stream: Secondary | ICD-10-CM | POA: Diagnosis not present

## 2021-08-22 DIAGNOSIS — D125 Benign neoplasm of sigmoid colon: Secondary | ICD-10-CM | POA: Diagnosis not present

## 2021-08-22 DIAGNOSIS — K648 Other hemorrhoids: Secondary | ICD-10-CM | POA: Diagnosis not present

## 2021-08-22 DIAGNOSIS — D122 Benign neoplasm of ascending colon: Secondary | ICD-10-CM | POA: Diagnosis not present

## 2021-08-22 DIAGNOSIS — Z86010 Personal history of colonic polyps: Secondary | ICD-10-CM | POA: Diagnosis not present

## 2021-08-22 DIAGNOSIS — K5730 Diverticulosis of large intestine without perforation or abscess without bleeding: Secondary | ICD-10-CM | POA: Diagnosis not present

## 2021-08-28 DIAGNOSIS — H353131 Nonexudative age-related macular degeneration, bilateral, early dry stage: Secondary | ICD-10-CM | POA: Diagnosis not present

## 2021-08-28 DIAGNOSIS — D125 Benign neoplasm of sigmoid colon: Secondary | ICD-10-CM | POA: Diagnosis not present

## 2021-08-28 DIAGNOSIS — D122 Benign neoplasm of ascending colon: Secondary | ICD-10-CM | POA: Diagnosis not present

## 2021-09-07 DIAGNOSIS — R195 Other fecal abnormalities: Secondary | ICD-10-CM | POA: Diagnosis not present

## 2021-09-17 DIAGNOSIS — E1151 Type 2 diabetes mellitus with diabetic peripheral angiopathy without gangrene: Secondary | ICD-10-CM | POA: Diagnosis not present

## 2021-09-17 DIAGNOSIS — Z0001 Encounter for general adult medical examination with abnormal findings: Secondary | ICD-10-CM | POA: Diagnosis not present

## 2021-09-17 DIAGNOSIS — Z79899 Other long term (current) drug therapy: Secondary | ICD-10-CM | POA: Diagnosis not present

## 2021-09-17 DIAGNOSIS — Z23 Encounter for immunization: Secondary | ICD-10-CM | POA: Diagnosis not present

## 2021-09-17 DIAGNOSIS — J449 Chronic obstructive pulmonary disease, unspecified: Secondary | ICD-10-CM | POA: Diagnosis not present

## 2021-09-17 DIAGNOSIS — I10 Essential (primary) hypertension: Secondary | ICD-10-CM | POA: Diagnosis not present

## 2021-09-17 DIAGNOSIS — I739 Peripheral vascular disease, unspecified: Secondary | ICD-10-CM | POA: Diagnosis not present

## 2021-09-17 DIAGNOSIS — E7800 Pure hypercholesterolemia, unspecified: Secondary | ICD-10-CM | POA: Diagnosis not present

## 2021-12-04 DIAGNOSIS — H00012 Hordeolum externum right lower eyelid: Secondary | ICD-10-CM | POA: Diagnosis not present

## 2021-12-19 DIAGNOSIS — R69 Illness, unspecified: Secondary | ICD-10-CM | POA: Diagnosis not present

## 2022-03-13 DIAGNOSIS — H353131 Nonexudative age-related macular degeneration, bilateral, early dry stage: Secondary | ICD-10-CM | POA: Diagnosis not present

## 2022-04-01 DIAGNOSIS — M9903 Segmental and somatic dysfunction of lumbar region: Secondary | ICD-10-CM | POA: Diagnosis not present

## 2022-04-01 DIAGNOSIS — M5136 Other intervertebral disc degeneration, lumbar region: Secondary | ICD-10-CM | POA: Diagnosis not present

## 2022-04-02 DIAGNOSIS — M5136 Other intervertebral disc degeneration, lumbar region: Secondary | ICD-10-CM | POA: Diagnosis not present

## 2022-04-02 DIAGNOSIS — M9903 Segmental and somatic dysfunction of lumbar region: Secondary | ICD-10-CM | POA: Diagnosis not present

## 2022-04-04 DIAGNOSIS — M9903 Segmental and somatic dysfunction of lumbar region: Secondary | ICD-10-CM | POA: Diagnosis not present

## 2022-04-04 DIAGNOSIS — M5136 Other intervertebral disc degeneration, lumbar region: Secondary | ICD-10-CM | POA: Diagnosis not present

## 2022-04-09 DIAGNOSIS — M9903 Segmental and somatic dysfunction of lumbar region: Secondary | ICD-10-CM | POA: Diagnosis not present

## 2022-04-09 DIAGNOSIS — M5136 Other intervertebral disc degeneration, lumbar region: Secondary | ICD-10-CM | POA: Diagnosis not present

## 2022-04-10 DIAGNOSIS — M5136 Other intervertebral disc degeneration, lumbar region: Secondary | ICD-10-CM | POA: Diagnosis not present

## 2022-04-10 DIAGNOSIS — M9903 Segmental and somatic dysfunction of lumbar region: Secondary | ICD-10-CM | POA: Diagnosis not present

## 2022-04-11 DIAGNOSIS — M5136 Other intervertebral disc degeneration, lumbar region: Secondary | ICD-10-CM | POA: Diagnosis not present

## 2022-04-11 DIAGNOSIS — M9903 Segmental and somatic dysfunction of lumbar region: Secondary | ICD-10-CM | POA: Diagnosis not present

## 2022-04-15 DIAGNOSIS — M5136 Other intervertebral disc degeneration, lumbar region: Secondary | ICD-10-CM | POA: Diagnosis not present

## 2022-04-15 DIAGNOSIS — M9903 Segmental and somatic dysfunction of lumbar region: Secondary | ICD-10-CM | POA: Diagnosis not present

## 2022-04-17 DIAGNOSIS — M5136 Other intervertebral disc degeneration, lumbar region: Secondary | ICD-10-CM | POA: Diagnosis not present

## 2022-04-17 DIAGNOSIS — M9903 Segmental and somatic dysfunction of lumbar region: Secondary | ICD-10-CM | POA: Diagnosis not present

## 2022-04-22 DIAGNOSIS — M5136 Other intervertebral disc degeneration, lumbar region: Secondary | ICD-10-CM | POA: Diagnosis not present

## 2022-04-22 DIAGNOSIS — M9903 Segmental and somatic dysfunction of lumbar region: Secondary | ICD-10-CM | POA: Diagnosis not present

## 2022-04-24 DIAGNOSIS — M5136 Other intervertebral disc degeneration, lumbar region: Secondary | ICD-10-CM | POA: Diagnosis not present

## 2022-04-24 DIAGNOSIS — M9903 Segmental and somatic dysfunction of lumbar region: Secondary | ICD-10-CM | POA: Diagnosis not present

## 2022-04-30 DIAGNOSIS — M9903 Segmental and somatic dysfunction of lumbar region: Secondary | ICD-10-CM | POA: Diagnosis not present

## 2022-04-30 DIAGNOSIS — M5136 Other intervertebral disc degeneration, lumbar region: Secondary | ICD-10-CM | POA: Diagnosis not present

## 2022-05-07 DIAGNOSIS — M5136 Other intervertebral disc degeneration, lumbar region: Secondary | ICD-10-CM | POA: Diagnosis not present

## 2022-05-07 DIAGNOSIS — M9903 Segmental and somatic dysfunction of lumbar region: Secondary | ICD-10-CM | POA: Diagnosis not present

## 2022-05-28 DIAGNOSIS — R3912 Poor urinary stream: Secondary | ICD-10-CM | POA: Diagnosis not present

## 2022-05-31 DIAGNOSIS — L570 Actinic keratosis: Secondary | ICD-10-CM | POA: Diagnosis not present

## 2022-07-29 DIAGNOSIS — D1801 Hemangioma of skin and subcutaneous tissue: Secondary | ICD-10-CM | POA: Diagnosis not present

## 2022-07-29 DIAGNOSIS — L728 Other follicular cysts of the skin and subcutaneous tissue: Secondary | ICD-10-CM | POA: Diagnosis not present

## 2022-07-29 DIAGNOSIS — L570 Actinic keratosis: Secondary | ICD-10-CM | POA: Diagnosis not present

## 2022-07-29 DIAGNOSIS — L814 Other melanin hyperpigmentation: Secondary | ICD-10-CM | POA: Diagnosis not present

## 2022-07-29 DIAGNOSIS — L821 Other seborrheic keratosis: Secondary | ICD-10-CM | POA: Diagnosis not present

## 2022-08-02 DIAGNOSIS — R1031 Right lower quadrant pain: Secondary | ICD-10-CM | POA: Diagnosis not present

## 2022-08-02 DIAGNOSIS — Z23 Encounter for immunization: Secondary | ICD-10-CM | POA: Diagnosis not present

## 2022-08-22 DIAGNOSIS — R3912 Poor urinary stream: Secondary | ICD-10-CM | POA: Diagnosis not present

## 2022-08-23 ENCOUNTER — Other Ambulatory Visit: Payer: Self-pay | Admitting: Urology

## 2022-08-23 DIAGNOSIS — R972 Elevated prostate specific antigen [PSA]: Secondary | ICD-10-CM

## 2022-08-28 DIAGNOSIS — H353131 Nonexudative age-related macular degeneration, bilateral, early dry stage: Secondary | ICD-10-CM | POA: Diagnosis not present

## 2022-09-08 ENCOUNTER — Ambulatory Visit
Admission: RE | Admit: 2022-09-08 | Discharge: 2022-09-08 | Disposition: A | Discharge status: Home / Self Care | Payer: Medicare Other | Source: Ambulatory Visit | Attending: Urology | Admitting: Urology

## 2022-09-08 DIAGNOSIS — R972 Elevated prostate specific antigen [PSA]: Secondary | ICD-10-CM

## 2022-09-08 MED ORDER — GADOPICLENOL 0.5 MMOL/ML IV SOLN
7.0000 mL | Freq: Once | INTRAVENOUS | Status: AC | PRN
  Administered 2022-09-08: 7 mL via INTRAVENOUS

## 2022-11-01 ENCOUNTER — Other Ambulatory Visit: Payer: Self-pay | Admitting: Urology

## 2022-11-05 ENCOUNTER — Other Ambulatory Visit (HOSPITAL_COMMUNITY): Payer: Self-pay | Admitting: Urology

## 2022-11-05 DIAGNOSIS — R972 Elevated prostate specific antigen [PSA]: Secondary | ICD-10-CM

## 2022-11-19 ENCOUNTER — Encounter (HOSPITAL_BASED_OUTPATIENT_CLINIC_OR_DEPARTMENT_OTHER): Payer: Self-pay | Admitting: Urology

## 2022-11-19 NOTE — Progress Notes (Signed)
Spoke w/ via phone for pre-op interview--- [place]---- ISTAT and EKG             Lab results------ COVID test -----patient states asymptomatic no test needed Arrive at -------1130 NPO after MN NO Solid Food.  Clear liquids from MN until---1030 Med rec completed Medications to take morning of surgery ----- Norvasc and Flovent Diabetic medication ----- Patient instructed no nail polish to be worn day of surgery Patient instructed to bring photo id and insurance card day of surgery Patient aware to have Driver (ride ) / caregiver  Andrew Jarvis  for 24 hours after surgery  Patient Special Instructions ----- Pre-Op special Istructions ----- FLEETS enema per surgeon instructions. Patient verbalized understanding of instructions that were given at this phone interview. Patient denies shortness of breath, chest pain, fever, cough at this phone interview.

## 2022-11-20 NOTE — H&P (Signed)
Radiology Visit Report     10/29/2022   --------------------------------------------------------------------------------   Laural Jarvis  MRN: [id number]  DOB: [birth year], 77 year old Male  SSN: [id number]   PRIMARY CARE:  Andrew Koirala, MD  PRIMARY CARE FAX:  [phone]  REFERRING:  Andrew Dover, MD  PROVIDER:  Festus Jarvis, M.D.  LOCATION:  [facility] [phone]     --------------------------------------------------------------------------------   CC/HPI: F/u -   1) BPH - since 2012. On tams and 5ari. PSA was 0.89 in 2020. Large prostate on [place] and CT in 2011-2013 during a bout of retention. Prostate was 150 g on November 2018 CT.   His 08/20/2016 PSA was 1.63. Prior cysto showed an intravesical median lobe. AUASS = 5, mostly satisfied. PVR 88 ml. Cysto repeated 12/03/2017 for hematuria and showed a large prostate - staged TURP vs robotic approach or HoLEP. He continued finasteride and tamsulosin. Bladder scan 120 ml. UA clear. AUASS = 2-3. No gross hematuria.   2) PSA elevation - his PSA in 2022 was 1.97 (3.94), Jul 2023 3.7 (7.4) and Oct 2023 PSA 4.89 (9.78). No prior bx. His brother had PCa.   Nov 2023 pMRI - a 12 mm PIRADS 4 left lateral PZ, negative staging. Prostate 141 grams. Presents for fusion. he is well with no dysuria or gross hematuria.     GU PHYSICAL EXAMINATION:    Anus and Perineum: No hemorrhoids. Anal stenosis - finger was tight and I could not get the probe inserted. No rectal fissure, no anal fissure. No edema, no dimple, no perineal tenderness, no anal tenderness.   Prostate: Prostate about 40 grams. Left lobe normal consistency, right lobe normal consistency. Symmetrical lobes. No prostate nodule. Left lobe no tenderness, right lobe no tenderness.    MULTI-SYSTEM PHYSICAL EXAMINATION:    Constitutional: Well-nourished. No physical deformities. Normally developed. Good grooming.  Neck: Neck symmetrical, not swollen. Normal  tracheal position.  Respiratory: No labored breathing, no use of accessory muscles.   Cardiovascular: Normal temperature, normal extremity pulses, no swelling, no varicosities.  Skin: No paleness, no jaundice, no cyanosis. No lesion, no ulcer, no rash.  Neurologic / Psychiatric: Oriented to time, oriented to place, oriented to person. No depression, no anxiety, no agitation.  Gastrointestinal: No mass, no tenderness, no rigidity, non obese abdomen.     PROCEDURES:          Urinalysis Dipstick Dipstick Cont'd  Color: Yellow Bilirubin: Neg mg/dL  Appearance: Clear Ketones: Neg mg/dL  Specific Gravity: 1.010 Blood: Neg ery/uL  pH: <=5.0 Protein: Neg mg/dL  Glucose: Neg mg/dL Urobilinogen: 0.2 mg/dL    Nitrites: Neg    Leukocyte Esterase: Neg leu/uL    ASSESSMENT:      ICD-10 Details  1 GU:   Elevated PSA - R97.20 Chronic, Stable - He will be schedule for TRUS under anesthesia with cognitive fusion.    PLAN:           Schedule Return Visit/Planned Activity: Next Available Appointment - Schedule Surgery          Document Letter(s):  Created for Patient: Clinical Summary    * Signed by Andrew Jarvis, M.D. on 10/30/22 at 5:20 PM (EST)*      The information contained in this medical record document is considered private and confidential patient information. This information can only be used for the medical diagnosis and/or medical services that are being provided by the patient's selected caregivers. This information can only be distributed outside  of the patient's care if the patient agrees and signs waivers of authorization for this information to be sent to an outside source or route.

## 2022-11-22 ENCOUNTER — Ambulatory Visit (HOSPITAL_BASED_OUTPATIENT_CLINIC_OR_DEPARTMENT_OTHER): Payer: Medicare Other | Admitting: Certified Registered Nurse Anesthetist

## 2022-11-22 ENCOUNTER — Encounter (HOSPITAL_BASED_OUTPATIENT_CLINIC_OR_DEPARTMENT_OTHER): Payer: Self-pay | Admitting: Urology

## 2022-11-22 ENCOUNTER — Ambulatory Visit (HOSPITAL_BASED_OUTPATIENT_CLINIC_OR_DEPARTMENT_OTHER)
Admission: RE | Admit: 2022-11-22 | Discharge: 2022-11-22 | Disposition: A | Discharge status: Home / Self Care | Payer: Medicare Other | Source: Ambulatory Visit | Attending: Urology | Admitting: Urology

## 2022-11-22 ENCOUNTER — Encounter (HOSPITAL_BASED_OUTPATIENT_CLINIC_OR_DEPARTMENT_OTHER)
Admission: RE | Disposition: A | Discharge status: Home / Self Care | Payer: Self-pay | Source: Ambulatory Visit | Attending: Urology

## 2022-11-22 ENCOUNTER — Ambulatory Visit (HOSPITAL_COMMUNITY)
Admission: RE | Admit: 2022-11-22 | Discharge: 2022-11-22 | Disposition: A | Discharge status: Home / Self Care | Payer: Medicare Other | Source: Ambulatory Visit | Attending: Urology | Admitting: Urology

## 2022-11-22 ENCOUNTER — Other Ambulatory Visit: Payer: Self-pay

## 2022-11-22 DIAGNOSIS — J449 Chronic obstructive pulmonary disease, unspecified: Secondary | ICD-10-CM | POA: Diagnosis not present

## 2022-11-22 DIAGNOSIS — R972 Elevated prostate specific antigen [PSA]: Secondary | ICD-10-CM

## 2022-11-22 DIAGNOSIS — I10 Essential (primary) hypertension: Secondary | ICD-10-CM | POA: Diagnosis not present

## 2022-11-22 DIAGNOSIS — C61 Malignant neoplasm of prostate: Secondary | ICD-10-CM | POA: Diagnosis not present

## 2022-11-22 DIAGNOSIS — Z79899 Other long term (current) drug therapy: Secondary | ICD-10-CM | POA: Insufficient documentation

## 2022-11-22 DIAGNOSIS — Z7984 Long term (current) use of oral hypoglycemic drugs: Secondary | ICD-10-CM | POA: Diagnosis not present

## 2022-11-22 DIAGNOSIS — N400 Enlarged prostate without lower urinary tract symptoms: Secondary | ICD-10-CM | POA: Insufficient documentation

## 2022-11-22 DIAGNOSIS — F17210 Nicotine dependence, cigarettes, uncomplicated: Secondary | ICD-10-CM | POA: Diagnosis not present

## 2022-11-22 DIAGNOSIS — F17200 Nicotine dependence, unspecified, uncomplicated: Secondary | ICD-10-CM | POA: Insufficient documentation

## 2022-11-22 DIAGNOSIS — E119 Type 2 diabetes mellitus without complications: Secondary | ICD-10-CM

## 2022-11-22 HISTORY — DX: Chronic obstructive pulmonary disease, unspecified: J44.9

## 2022-11-22 HISTORY — PX: PROSTATE BIOPSY: SHX241

## 2022-11-22 LAB — POCT I-STAT, CHEM 8
BUN: 10 mg/dL (ref 8–23)
Calcium, Ion: 1.19 mmol/L (ref 1.15–1.40)
Chloride: 103 mmol/L (ref 98–111)
Creatinine, Ser: 0.9 mg/dL (ref 0.61–1.24)
Glucose, Bld: 92 mg/dL (ref 70–99)
HCT: 42 % (ref 39.0–52.0)
Hemoglobin: 14.3 g/dL (ref 13.0–17.0)
Potassium: 3.9 mmol/L (ref 3.5–5.1)
Sodium: 139 mmol/L (ref 135–145)
TCO2: 24 mmol/L (ref 22–32)

## 2022-11-22 SURGERY — BIOPSY, PROSTATE, RECTAL APPROACH, WITH US GUIDANCE
Anesthesia: Monitor Anesthesia Care | Site: Rectum

## 2022-11-22 MED ORDER — PROPOFOL 10 MG/ML IV BOLUS
INTRAVENOUS | Status: DC | PRN
  Administered 2022-11-22: 20 mg via INTRAVENOUS
  Administered 2022-11-22 (×3): 10 mg via INTRAVENOUS

## 2022-11-22 MED ORDER — CEFAZOLIN SODIUM 1 G IJ SOLR
INTRAMUSCULAR | Status: AC
  Filled 2022-11-22: qty 20

## 2022-11-22 MED ORDER — PROPOFOL 10 MG/ML IV BOLUS
INTRAVENOUS | Status: AC
  Filled 2022-11-22: qty 20

## 2022-11-22 MED ORDER — CIPROFLOXACIN HCL 500 MG PO TABS: 500.0000 mg | ORAL_TABLET | Freq: Two times a day (BID) | ORAL | 0 refills | Status: DC

## 2022-11-22 MED ORDER — LACTATED RINGERS IV SOLN: INTRAVENOUS | Status: DC

## 2022-11-22 MED ORDER — EPHEDRINE SULFATE-NACL 50-0.9 MG/10ML-% IV SOSY
PREFILLED_SYRINGE | INTRAVENOUS | Status: DC | PRN
  Administered 2022-11-22 (×2): 10 mg via INTRAVENOUS

## 2022-11-22 MED ORDER — EPHEDRINE 5 MG/ML INJ
INTRAVENOUS | Status: AC
  Filled 2022-11-22: qty 5

## 2022-11-22 MED ORDER — FENTANYL CITRATE (PF) 100 MCG/2ML IJ SOLN
INTRAMUSCULAR | Status: AC
  Filled 2022-11-22: qty 2

## 2022-11-22 MED ORDER — SODIUM CHLORIDE (PF) 0.9 % IJ SOLN
INTRAMUSCULAR | Status: AC
  Filled 2022-11-22: qty 10

## 2022-11-22 MED ORDER — PROPOFOL 500 MG/50ML IV EMUL
INTRAVENOUS | Status: DC | PRN
  Administered 2022-11-22: 200 ug/kg/min via INTRAVENOUS

## 2022-11-22 MED ORDER — PHENYLEPHRINE 80 MCG/ML (10ML) SYRINGE FOR IV PUSH (FOR BLOOD PRESSURE SUPPORT)
PREFILLED_SYRINGE | INTRAVENOUS | Status: AC
  Filled 2022-11-22: qty 20

## 2022-11-22 MED ORDER — FENTANYL CITRATE (PF) 100 MCG/2ML IJ SOLN
INTRAMUSCULAR | Status: DC | PRN
  Administered 2022-11-22: 25 ug via INTRAVENOUS

## 2022-11-22 MED ORDER — ONDANSETRON HCL 4 MG/2ML IJ SOLN
INTRAMUSCULAR | Status: DC | PRN
  Administered 2022-11-22: 4 mg via INTRAVENOUS

## 2022-11-22 MED ORDER — ONDANSETRON HCL 4 MG/2ML IJ SOLN
INTRAMUSCULAR | Status: AC
  Filled 2022-11-22: qty 2

## 2022-11-22 MED ORDER — CEFAZOLIN SODIUM-DEXTROSE 2-3 GM-%(50ML) IV SOLR
INTRAVENOUS | Status: DC | PRN
  Administered 2022-11-22: 2 g via INTRAVENOUS

## 2022-11-22 MED ORDER — LIDOCAINE HCL 2 % IJ SOLN
INTRAMUSCULAR | Status: DC | PRN
  Administered 2022-11-22: 10 mL

## 2022-11-22 SURGICAL SUPPLY — 9 items
GLOVE SURG SS PI 6.5 STRL IVOR (GLOVE) IMPLANT
INST BIOPSY MAXCORE 18GX25 (NEEDLE) ×1 IMPLANT
KIT TURNOVER CYSTO (KITS) ×1 IMPLANT
NDL SAFETY ECLIP 18X1.5 (MISCELLANEOUS) IMPLANT
NDL SPNL 22GX7 QUINCKE BK (NEEDLE) IMPLANT
NEEDLE SPNL 22GX7 QUINCKE BK (NEEDLE) ×1 IMPLANT
SURGILUBE 2OZ TUBE FLIPTOP (MISCELLANEOUS) IMPLANT
SYR 10ML LL (SYRINGE) IMPLANT
UNDERPAD 30X36 HEAVY ABSORB (UNDERPADS AND DIAPERS) ×1 IMPLANT

## 2022-11-22 NOTE — Transfer of Care (Signed)
Immediate Anesthesia Transfer of Care Note  Patient: Andrew Jarvis  Procedure(s) Performed: BIOPSY TRANSRECTAL ULTRASONIC PROSTATE (TUBP) (Rectum)  Patient Location: PACU  Anesthesia Type:MAC  Level of Consciousness: awake, alert , oriented, and patient cooperative  Airway & Oxygen Therapy: Patient Spontanous Breathing  Post-op Assessment: Report given to RN and Post -op Vital signs reviewed and stable  Post vital signs: Reviewed and stable  Last Vitals:  Vitals Value Taken Time  BP    Temp    Pulse    Resp    SpO2      Last Pain:  Vitals:   11/22/22 1149  TempSrc: Oral  PainSc: 0-No pain      Patients Stated Pain Goal: 4 (18/98/42 1031)  Complications: No notable events documented.

## 2022-11-22 NOTE — Discharge Instructions (Signed)
  Post Anesthesia Home Care Instructions ? ?Activity: ?Get plenty of rest for the remainder of the day. A responsible individual must stay with you for 24 hours following the procedure.  ?For the next 24 hours, DO NOT: ?-Drive a car ?-Operate machinery ?-Drink alcoholic beverages ?-Take any medication unless instructed by your physician ?-Make any legal decisions or sign important papers. ? ?Meals: ?Start with liquid foods such as gelatin or soup. Progress to regular foods as tolerated. Avoid greasy, spicy, heavy foods. If nausea and/or vomiting occur, drink only clear liquids until the nausea and/or vomiting subsides. Call your physician if vomiting continues. ? ?Special Instructions/Symptoms: ?Your throat may feel dry or sore from the anesthesia or the breathing tube placed in your throat during surgery. If this causes discomfort, gargle with warm salt water. The discomfort should disappear within 24 hours. ? ? ?    ?

## 2022-11-22 NOTE — Anesthesia Procedure Notes (Signed)
Procedure Name: MAC Date/Time: 11/22/2022 1:46 PM  Performed by: Rogers Blocker, CRNAPre-anesthesia Checklist: Patient identified, Emergency Drugs available, Suction available and Patient being monitored Oxygen Delivery Method: Simple face mask Placement Confirmation: positive ETCO2

## 2022-11-22 NOTE — Interval H&P Note (Signed)
History and Physical Interval Note:  11/22/2022 1:25 PM  Andrew Jarvis  has presented today for surgery, with the diagnosis of [place].  The various methods of treatment have been discussed with the patient and family. After consideration of risks, benefits and other options for treatment, the patient has consented to  Procedure(s) with comments: BIOPSY TRANSRECTAL ULTRASONIC PROSTATE (TUBP) (N/A) - 30 MINS  FOR CASE as a surgical intervention.  The patient's history has been reviewed, patient examined, no change in status, stable for surgery.  I have reviewed the patient's chart and labs.  He is well. No dysuria or gross hematuria. Questions were answered to the patient's satisfaction.     Festus Aloe

## 2022-11-22 NOTE — Op Note (Signed)
Preoperative diagnosis: Elevated PSA Postoperative diagnosis: Same  Procedure: Transrectal ultrasound of prostate and transrectal ultrasound-guided prostate biopsy  Surgeon: Junious Silk  Anesthesia: General  Indication for procedure: Andrew Jarvis is a 77 year old male with a history of BPH on finasteride.  His PSA was elevated when taking into account the 5 alpha reductase inhibitor.  A prostate MRI was obtained which revealed a small PI-RADS 4 lesion left peripheral zone lateral prostate in the mid region.  He did not tolerate prostate biopsy in office as he had a very tight rectal sphincter.  Findings: Exam/DRE the prostate was about 50 g with no significant nodularity.  On transrectal ultrasound the capsule appeared normal, there was an enlarged median lobe, I did see a hypoechoic area in the left lateral region which likely correlates to the MRI.  This was biopsied x 2 incorporating the left mid lateral left apex lateral biopsies into this area. Prostate measured 4.85 x 5.99 x 3.97 for a volume of 58 g or cc.  We did not include the median lobe.  I wanted to compare it to the MRI.  The prostate measured 141 g on the prostate MRI, but this measurement incorporated the median lobe.  Description of procedure: After consent was obtained patient brought the operating room.  After adequate anesthesia he was placed in the left lateral decubitus position.  His knees were pulled up toward his chest.  I did a digital rectal exam and then used 2 fingers to gently dilate the rectal sphincter.  This allowed the ultrasound probe to go him with some resistance but not anything significant.  Prostate ultrasound transrectal was performed after instillation of 10 cc of lidocaine plain with 5 at the junction of the right seminal vesicle and prostate and 5 at the junction of the left seminal vesicle and prostate.  The ultrasound was then used to guide the needle and standard 12 core biopsy was obtained.  Follow-up rectal exam  noted there to be no injury and no significant blood on the gloved finger.  He was then awakened and taken the cover room in stable condition.  Complications: None  Blood loss: Minimal  Specimens to pathology: #1 right base lateral #2 right base medial #3 right mid lateral #4 right mid medial #5 right apex lateral #6 right apex medial #7 left base lateral #8 left base medial #9 left mid lateral #10 left mid medial #11 left apex lateral #12 left apex medial  Drains: None  Disposition: Patient stable to PACU

## 2022-11-22 NOTE — Anesthesia Preprocedure Evaluation (Addendum)
Anesthesia Evaluation  Patient identified by MRN, date of birth, ID band Patient awake    Reviewed: Allergy & Precautions, NPO status , Patient's Chart, lab work & pertinent test results  History of Anesthesia Complications Negative for: history of anesthetic complications  Airway Mallampati: II  TM Distance: >3 FB Neck ROM: Full    Dental  (+) Dental Advisory Given, Teeth Intact   Pulmonary COPD,  COPD inhaler, Current Smoker and Patient abstained from smoking.   Pulmonary exam normal        Cardiovascular hypertension, Pt. on medications Normal cardiovascular exam     Neuro/Psych  PSYCHIATRIC DISORDERS  Depression    negative neurological ROS     GI/Hepatic negative GI ROS, Neg liver ROS,,,  Endo/Other  diabetes, Type 2, Oral Hypoglycemic Agents    Renal/GU negative Renal ROS     Musculoskeletal negative musculoskeletal ROS (+)    Abdominal   Peds  Hematology negative hematology ROS (+)   Anesthesia Other Findings   Reproductive/Obstetrics                             Anesthesia Physical Anesthesia Plan  ASA: 3  Anesthesia Plan: MAC   Post-op Pain Management: Minimal or no pain anticipated   Induction:   PONV Risk Score and Plan: 0 and Propofol infusion and Treatment may vary due to age or medical condition  Airway Management Planned: Natural Airway and Simple Face Mask  Additional Equipment: None  Intra-op Plan:   Post-operative Plan:   Informed Consent: I have reviewed the patients History and Physical, chart, labs and discussed the procedure including the risks, benefits and alternatives for the proposed anesthesia with the patient or authorized representative who has indicated his/her understanding and acceptance.       Plan Discussed with: CRNA and Anesthesiologist  Anesthesia Plan Comments:        Anesthesia Quick Evaluation

## 2022-11-22 NOTE — Anesthesia Postprocedure Evaluation (Signed)
Anesthesia Post Note  Patient: Andrew Jarvis  Procedure(s) Performed: BIOPSY TRANSRECTAL ULTRASONIC PROSTATE (TUBP) (Rectum)     Patient location during evaluation: PACU Anesthesia Type: MAC Level of consciousness: awake and alert Pain management: pain level controlled Vital Signs Assessment: post-procedure vital signs reviewed and stable Respiratory status: spontaneous breathing, nonlabored ventilation and respiratory function stable Cardiovascular status: stable and blood pressure returned to baseline Anesthetic complications: no   No notable events documented.  Last Vitals:  Vitals:   11/22/22 1428 11/22/22 1430  BP: (!) 121/54 (!) 124/54  Pulse: 71 71  Resp: (!) 22 18  Temp:    SpO2: 96% 96%    Last Pain:  Vitals:   11/22/22 1428  TempSrc:   PainSc: 0-No pain                 Audry Pili

## 2022-11-25 ENCOUNTER — Encounter (HOSPITAL_BASED_OUTPATIENT_CLINIC_OR_DEPARTMENT_OTHER): Payer: Self-pay | Admitting: Urology

## 2022-11-27 DIAGNOSIS — I739 Peripheral vascular disease, unspecified: Secondary | ICD-10-CM | POA: Diagnosis not present

## 2022-11-27 DIAGNOSIS — J449 Chronic obstructive pulmonary disease, unspecified: Secondary | ICD-10-CM | POA: Diagnosis not present

## 2022-11-27 DIAGNOSIS — H35033 Hypertensive retinopathy, bilateral: Secondary | ICD-10-CM | POA: Diagnosis not present

## 2022-11-27 DIAGNOSIS — E1151 Type 2 diabetes mellitus with diabetic peripheral angiopathy without gangrene: Secondary | ICD-10-CM | POA: Diagnosis not present

## 2022-11-27 DIAGNOSIS — E7800 Pure hypercholesterolemia, unspecified: Secondary | ICD-10-CM | POA: Diagnosis not present

## 2022-11-27 DIAGNOSIS — F17210 Nicotine dependence, cigarettes, uncomplicated: Secondary | ICD-10-CM | POA: Diagnosis not present

## 2022-11-27 DIAGNOSIS — Z0001 Encounter for general adult medical examination with abnormal findings: Secondary | ICD-10-CM | POA: Diagnosis not present

## 2022-11-27 DIAGNOSIS — I10 Essential (primary) hypertension: Secondary | ICD-10-CM | POA: Diagnosis not present

## 2022-11-27 DIAGNOSIS — Z79899 Other long term (current) drug therapy: Secondary | ICD-10-CM | POA: Diagnosis not present

## 2022-11-27 LAB — SURGICAL PATHOLOGY

## 2022-12-24 DIAGNOSIS — D72829 Elevated white blood cell count, unspecified: Secondary | ICD-10-CM | POA: Diagnosis not present

## 2023-02-27 ENCOUNTER — Other Ambulatory Visit: Payer: Self-pay | Admitting: *Deleted

## 2023-02-27 DIAGNOSIS — D72829 Elevated white blood cell count, unspecified: Secondary | ICD-10-CM

## 2023-03-04 ENCOUNTER — Other Ambulatory Visit (HOSPITAL_COMMUNITY): Payer: Self-pay | Admitting: Urology

## 2023-03-04 DIAGNOSIS — C61 Malignant neoplasm of prostate: Secondary | ICD-10-CM

## 2023-03-04 DIAGNOSIS — H35363 Drusen (degenerative) of macula, bilateral: Secondary | ICD-10-CM | POA: Diagnosis not present

## 2023-03-04 DIAGNOSIS — H353131 Nonexudative age-related macular degeneration, bilateral, early dry stage: Secondary | ICD-10-CM | POA: Diagnosis not present

## 2023-03-06 ENCOUNTER — Inpatient Hospital Stay: Payer: Medicare Other

## 2023-03-06 ENCOUNTER — Inpatient Hospital Stay: Payer: Medicare Other | Attending: Oncology | Admitting: Oncology

## 2023-03-06 VITALS — BP 128/59 | HR 77 | Temp 98.1°F | Resp 18 | Ht 66.0 in | Wt 125.0 lb

## 2023-03-06 DIAGNOSIS — F17210 Nicotine dependence, cigarettes, uncomplicated: Secondary | ICD-10-CM | POA: Diagnosis not present

## 2023-03-06 DIAGNOSIS — Z8042 Family history of malignant neoplasm of prostate: Secondary | ICD-10-CM

## 2023-03-06 DIAGNOSIS — D72829 Elevated white blood cell count, unspecified: Secondary | ICD-10-CM | POA: Insufficient documentation

## 2023-03-06 DIAGNOSIS — C61 Malignant neoplasm of prostate: Secondary | ICD-10-CM | POA: Insufficient documentation

## 2023-03-06 DIAGNOSIS — E119 Type 2 diabetes mellitus without complications: Secondary | ICD-10-CM

## 2023-03-06 DIAGNOSIS — D75839 Thrombocytosis, unspecified: Secondary | ICD-10-CM | POA: Insufficient documentation

## 2023-03-06 LAB — CBC WITH DIFFERENTIAL (CANCER CENTER ONLY)
Abs Immature Granulocytes: 0.06 10*3/uL (ref 0.00–0.07)
Basophils Absolute: 0.1 10*3/uL (ref 0.0–0.1)
Basophils Relative: 0 %
Eosinophils Absolute: 0.2 10*3/uL (ref 0.0–0.5)
Eosinophils Relative: 1 %
HCT: 37.9 % — ABNORMAL LOW (ref 39.0–52.0)
Hemoglobin: 12.8 g/dL — ABNORMAL LOW (ref 13.0–17.0)
Immature Granulocytes: 1 %
Lymphocytes Relative: 24 %
Lymphs Abs: 3.1 10*3/uL (ref 0.7–4.0)
MCH: 32.4 pg (ref 26.0–34.0)
MCHC: 33.8 g/dL (ref 30.0–36.0)
MCV: 95.9 fL (ref 80.0–100.0)
Monocytes Absolute: 1 10*3/uL (ref 0.1–1.0)
Monocytes Relative: 8 %
Neutro Abs: 8.7 10*3/uL — ABNORMAL HIGH (ref 1.7–7.7)
Neutrophils Relative %: 66 %
Platelet Count: 405 10*3/uL — ABNORMAL HIGH (ref 150–400)
RBC: 3.95 MIL/uL — ABNORMAL LOW (ref 4.22–5.81)
RDW: 13.8 % (ref 11.5–15.5)
WBC Count: 13.1 10*3/uL — ABNORMAL HIGH (ref 4.0–10.5)
nRBC: 0 % (ref 0.0–0.2)

## 2023-03-06 LAB — SAVE SMEAR(SSMR), FOR PROVIDER SLIDE REVIEW

## 2023-03-06 NOTE — Progress Notes (Signed)
[facility] New Patient Consult   Requesting MD: Darrow Bussing, Md [address] [place],  [place] [address]   Andrew Jarvis 77 y.o.  Dec 17, 1945    Reason for Consult: Leukocytosis   HPI: Andrew Jarvis is followed by Dr. Docia Chuck for primary care.  A CBC 11/27/2022 found a white count of 17.8, hemoglobin 13.5, MCV 93.9, platelets 521,000, and ANC 13.3. A repeat CBC on 12/24/2022 found the hemoglobin at 12.7, MCV 95.1, WBC 13.2, ANC 9.0, absolute lymphocyte count 2.8 (1.1-2.7), and platelets 381,000.  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  He is followed by Dr. Mena Goes and is being evaluated for treatment with radiation.  Andrew Jarvis reports no previous history of leukocytosis.  Past Medical History:  Diagnosis Date   COPD (chronic obstructive pulmonary disease) (HCC)    Depression    Depression    Diabetes (HCC)    ED (erectile dysfunction)    Hypertension     .  11/22/2022 -prostate cancer-Gleason 3+4 equal 7 involving 10% of 1 core at the left lateral base and less than 5% of 1 core at the left medial base   .  BPH   .  Past Surgical History:  Procedure Laterality Date   APPENDECTOMY     age 77   BACK SURGERY     2011   COLONOSCOPY     x 2, last 2022   PROSTATE BIOPSY N/A 11/22/2022   Procedure: BIOPSY TRANSRECTAL ULTRASONIC PROSTATE (TUBP);  Surgeon: Jerilee Field, MD;  Location: [facility];  Service: Urology;  Laterality: N/A;    Medications: Reviewed  Allergies: No Known Allergies  Family history: His brother had prostate cancer  Social History:   He lives with his significant other of 20 years in [place].  He is retired after working for [employer].  He smokes 1/2 pack cigarettes per day for the past 65 years.  Rare alcohol use.  No transfusion history.  No risk factor for HIV or hepatitis.  ROS:   Positives include: Altered food preference and early satiety following the lumbar injury in  2011  A complete ROS was otherwise negative.  Physical Exam:  Blood pressure (!) 128/59, pulse 77, temperature 98.1 F (36.7 C), temperature source Oral, resp. rate 18, height  (1.676 m), weight 125 lb (56.7 kg), SpO2 98 %.  HEENT: Multiple missing teeth, oropharynx without visible mass Lungs: Distant breath sounds, bilateral expiratory wheeze/rhonchi, no respiratory distress Cardiac: Distant heart sounds, regular rhythm by radial pulse Abdomen: No hepatosplenomegaly Vascular: No leg edema Lymph nodes: No cervical, supraclavicular, axillary, or inguinal nodes Neurologic: Alert and oriented, the motor exam appears intact in the upper and lower extremities bilaterally Skin: No rash   LAB:  CBC  Lab Results  Component Value Date   WBC 13.1 (H) 03/06/2023   HGB 12.8 (L) 03/06/2023   HCT 37.9 (L) 03/06/2023   MCV 95.9 03/06/2023   PLT 405 (H) 03/06/2023   NEUTROABS 8.7 (H) 03/06/2023    Blood smear: The platelets appear normal in number.  The majority the white cells are mature neutrophils and lymphocytes.  Few 4 and 5 lobed neutrophils.  No blasts or other young forms are seen.  Few ovalocytes and acanthocytes, rare teardrop    CMP  Lab Results  Component Value Date   NA 139 11/22/2022   K 3.9 11/22/2022   CL 103 11/22/2022   CO2 28 04/11/2010   GLUCOSE 92 11/22/2022  BUN 10 11/22/2022   CREATININE 0.90 11/22/2022   CALCIUM 9.1 04/11/2010   PROT 6.3 04/11/2010   ALBUMIN 3.4 (L) 04/11/2010   AST 15 04/11/2010   ALT 14 04/11/2010   ALKPHOS 47 04/11/2010   BILITOT 0.8 04/11/2010   GFRNONAA 53 (L) 04/11/2010   GFRAA  04/11/2010    >60        The eGFR has been calculated using the MDRD equation. This calculation has not been validated in all clinical situations. eGFR's persistently <60 mL/min signify possible Chronic Kidney Disease.        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  He appears to have longstanding mild leukocytosis (neutrophilia).  I suspect the neutrophilia is related to tobacco use.  It is possible the fluticasone inhaler is contributing.  There is no physical exam or blood smear finding to suggest a myeloproliferative disorder.  The platelet count was mildly elevated in January and again today.  We will check a ferritin level.  I suspect the mild thrombocytosis is related to benign variation or systemic inflammation.  I have a low suspicion for a myeloproliferative disorder.  We will check a myeloproliferative NGS panel if he develops progressive leukocytosis or thrombocytosis.  The hemoglobin is at the low end of the normal range.  I do not recommend further hematologic evaluation at present.  He will return for an office visit and CBC in 6 months.  He plans to follow-up with Dr. Mena Goes for management of the recently diagnosed prostate cancer.  I recommend Dr. Docia Chuck consider referring him for a lung cancer screening chest CT.  Thornton Papas, MD  03/06/2023, 2:06 PM

## 2023-03-26 NOTE — Progress Notes (Signed)
GU Location of Tumor / Histology: Prostate Ca  If Prostate Cancer, Gleason Score is (3 + 4) and PSA is (4.89 on 08/2022)  Biopsies      09/08/2022 Dr. Jerilee Field MR Prostate with/without Contrast CLINICAL DATA: Elevated PSA level. R97.20   IMPRESSION: 1. PI-RADS category 4 lesion of the left peripheral zone. Targeting data sent to [facility]. 2. Prostatomegaly and benign prostatic hypertrophy.    Past/Anticipated interventions by urology, if any:  No  Past/Anticipated interventions by medical oncology, if any:   Dr. Thornton Papas   Weight changes, if any: No  IPSS:  7 SHIM:  13  Bowel/Bladder complaints, if any: No  Nausea/Vomiting, if any: No  Pain issues, if any:  0/10  SAFETY ISSUES: Prior radiation?  No Pacemaker/ICD? No Possible current pregnancy? Male Is the patient on methotrexate? No  Current Complaints / other details:  No

## 2023-03-27 ENCOUNTER — Encounter (HOSPITAL_COMMUNITY)
Admission: RE | Admit: 2023-03-27 | Discharge: 2023-03-27 | Disposition: A | Discharge status: Home / Self Care | Payer: Medicare Other | Source: Ambulatory Visit | Attending: Urology | Admitting: Urology

## 2023-03-27 DIAGNOSIS — C61 Malignant neoplasm of prostate: Secondary | ICD-10-CM | POA: Diagnosis present

## 2023-03-27 MED ORDER — TECHNETIUM TC 99M MEDRONATE IV KIT: 20.0000 | PACK | Freq: Once | INTRAVENOUS | Status: DC | PRN

## 2023-03-31 DIAGNOSIS — C61 Malignant neoplasm of prostate: Secondary | ICD-10-CM | POA: Insufficient documentation

## 2023-04-01 ENCOUNTER — Ambulatory Visit
Admission: RE | Admit: 2023-04-01 | Discharge: 2023-04-01 | Disposition: A | Discharge status: Home / Self Care | Payer: Medicare Other | Source: Ambulatory Visit | Attending: Radiation Oncology | Admitting: Radiation Oncology

## 2023-04-01 ENCOUNTER — Encounter: Payer: Self-pay | Admitting: Radiation Oncology

## 2023-04-01 VITALS — BP 132/53 | HR 68 | Temp 96.8°F | Resp 18 | Ht 66.0 in | Wt 130.1 lb

## 2023-04-01 DIAGNOSIS — J449 Chronic obstructive pulmonary disease, unspecified: Secondary | ICD-10-CM | POA: Diagnosis not present

## 2023-04-01 DIAGNOSIS — Z7984 Long term (current) use of oral hypoglycemic drugs: Secondary | ICD-10-CM | POA: Diagnosis not present

## 2023-04-01 DIAGNOSIS — Z7951 Long term (current) use of inhaled steroids: Secondary | ICD-10-CM | POA: Insufficient documentation

## 2023-04-01 DIAGNOSIS — N529 Male erectile dysfunction, unspecified: Secondary | ICD-10-CM | POA: Insufficient documentation

## 2023-04-01 DIAGNOSIS — E119 Type 2 diabetes mellitus without complications: Secondary | ICD-10-CM | POA: Insufficient documentation

## 2023-04-01 DIAGNOSIS — C61 Malignant neoplasm of prostate: Secondary | ICD-10-CM | POA: Diagnosis present

## 2023-04-01 DIAGNOSIS — F17210 Nicotine dependence, cigarettes, uncomplicated: Secondary | ICD-10-CM | POA: Diagnosis not present

## 2023-04-01 DIAGNOSIS — Z191 Hormone sensitive malignancy status: Secondary | ICD-10-CM | POA: Diagnosis not present

## 2023-04-01 DIAGNOSIS — I10 Essential (primary) hypertension: Secondary | ICD-10-CM | POA: Diagnosis not present

## 2023-04-01 DIAGNOSIS — Z79899 Other long term (current) drug therapy: Secondary | ICD-10-CM | POA: Insufficient documentation

## 2023-04-01 NOTE — Progress Notes (Signed)
Introduced myself to the patient as the prostate nurse navigator.  No barriers to care identified at this time.  He is here to discuss his radiation treatment options, and will proceed with 5.5 weeks of daily radiation.  I gave him my business card and asked him to call me with questions or concerns.  Verbalized understanding.

## 2023-04-01 NOTE — Progress Notes (Signed)
Radiation Oncology         [phone] ________________________________  Initial Outpatient Consultation  Name: Andrew Jarvis MRN: [id number]  Date: 04/01/2023  DOB: [birth year]  BM:WUXLKGM, Dibas, MD  Jerilee Field, MD   REFERRING PHYSICIAN: Jerilee Field, MD  DIAGNOSIS: 77 y.o. gentleman with Stage T1c adenocarcinoma of the prostate with Gleason score of 3+4, and PSA of 17.2 (adjusted for finasteride).    ICD-10-CM   1. Malignant neoplasm of prostate (HCC)  C61       HISTORY OF PRESENT ILLNESS: Andrew Jarvis is a 77 y.o. male with a diagnosis of prostate cancer. He has a longstanding history of BPH with BOO and episodes of hematuria and AUR since at least 2011.His BPH has been managed with tamsulosin and finasteride for several years. More recently, he was noted to have a rise in his PSA from 0.89 in 03/2019 to 1.97 (3.94) in 2022 to 3.7 (7.4) in 2023 and up to 4.89 (9.78 adjusted for finasteride) in 08/2022. This prompted a prostate MRI which was performed on 09/08/22 showing a PI-RADS 4 lesion in the left peripheral zone. He was scheduled for prostate biopsy on 10/29/22 and digital rectal examination performed at that time showed no nodules. They instead opted to proceed with MRI fusion biopsy of the prostate under anesthesia on 11/22/22.  The prostate volume measured 58 cc (not including the median lobe) but prostate volume on MRI, including the median lobe, measured 141 gm.  Out of 12 core biopsies, 2 were positive.  The maximum Gleason score was 3+4, and this was seen in the left base lateral and left base medial (10% and <5% respectively).  A repeat PSA obtained in 02/2023 had increased further to 8.6 (17.2 adjusted for finasteride). Staging CT A/P and bone scan performed on 03/27/23 were negative for visceral or osseous metastasis.  The patient reviewed the biopsy and imaging results with his urologist and he has kindly been referred today for discussion of potential radiation  treatment options.   PREVIOUS RADIATION THERAPY: No  PAST MEDICAL HISTORY:  Past Medical History:  Diagnosis Date   COPD (chronic obstructive pulmonary disease) (HCC)    Depression    Depression    Diabetes (HCC)    ED (erectile dysfunction)    Hypertension       PAST SURGICAL HISTORY: Past Surgical History:  Procedure Laterality Date   APPENDECTOMY     age 53   BACK SURGERY     2011   COLONOSCOPY     x 2, last 2022   PROSTATE BIOPSY N/A 11/22/2022   Procedure: BIOPSY TRANSRECTAL ULTRASONIC PROSTATE (TUBP);  Surgeon: Jerilee Field, MD;  Location: [facility];  Service: Urology;  Laterality: N/A;    FAMILY HISTORY: History reviewed. No pertinent family history.  SOCIAL HISTORY:  Social History   Socioeconomic History   Marital status: Widowed    Spouse name: Not on file   Number of children: Not on file   Years of education: Not on file   Highest education level: Not on file  Occupational History   Not on file  Tobacco Use   Smoking status: Every Day    Packs/day: 0.50    Years: 65.00    Additional pack years: 0.00    Total pack years: 32.50    Types: Cigarettes   Smokeless tobacco: Not on file  Vaping Use   Vaping Use: Never used  Substance and Sexual Activity   Alcohol use: Never  Drug use: Never   Sexual activity: Not on file  Other Topics Concern   Not on file  Social History Narrative   Not on file   Social Determinants of Health   Financial Resource Strain: Not on file  Food Insecurity: No Food Insecurity (04/01/2023)   Hunger Vital Sign    Worried About Running Out of Food in the Last Year: Never true    Ran Out of Food in the Last Year: Never true  Transportation Needs: No Transportation Needs (04/01/2023)   PRAPARE - Administrator, Civil Service (Medical): No    Lack of Transportation (Non-Medical): No  Physical Activity: Not on file  Stress: Not on file  Social Connections: Not on file  Intimate Partner  Violence: Not At Risk (04/01/2023)   Humiliation, Afraid, Rape, and Kick questionnaire    Fear of Current or Ex-Partner: No    Emotionally Abused: No    Physically Abused: No    Sexually Abused: No    ALLERGIES: Patient has no known allergies.  MEDICATIONS:  Current Outpatient Medications  Medication Sig Dispense Refill   amLODipine (NORVASC) 10 MG tablet Take 10 mg by mouth daily.     finasteride (PROSCAR) 5 MG tablet Take 5 mg by mouth every morning.     Fluticasone-Salmeterol (ADVAIR) 500-50 MCG/DOSE AEPB Inhale 1 puff into the lungs 2 (two) times daily.     lisinopril (PRINIVIL,ZESTRIL) 20 MG tablet Take 20 mg by mouth daily.     metFORMIN (GLUCOPHAGE) 500 MG tablet Take 1,000 mg by mouth 2 (two) times daily with a meal.     Multiple Vitamins-Minerals (ICAPS AREDS 2 PO) Take 2 tablets by mouth daily at 6 (six) AM.     psyllium (METAMUCIL) 58.6 % packet Take 1 packet by mouth daily.     rosuvastatin (CRESTOR) 10 MG tablet Take 10 mg by mouth daily.     saccharomyces boulardii (FLORASTOR) 250 MG capsule Take 250 mg by mouth 2 (two) times daily.     tamsulosin (FLOMAX) 0.4 MG CAPS capsule Take 0.4 mg by mouth.     No current facility-administered medications for this encounter.   Facility-Administered Medications Ordered in Other Encounters  Medication Dose Route Frequency Provider Last Rate Last Admin   technetium medronate (TC-MDP) injection 20 millicurie  20 millicurie Intravenous Once PRN Herbie Baltimore, MD        REVIEW OF SYSTEMS:  On review of systems, the patient reports that he is doing well overall. He denies any chest pain, shortness of breath, cough, fevers, chills, night sweats, unintended weight changes. He denies any bowel disturbances, and denies abdominal pain, nausea or vomiting. He denies any new musculoskeletal or joint aches or pains. His IPSS was 7, indicating mild urinary symptoms. His SHIM was 13, indicating he has moderate erectile dysfunction. A complete  review of systems is obtained and is otherwise negative.    PHYSICAL EXAM:  Wt Readings from Last 3 Encounters:  04/01/23 130 lb 2 oz (59 kg)  03/06/23 125 lb (56.7 kg)  11/22/22 126 lb 8 oz (57.4 kg)   Temp Readings from Last 3 Encounters:  04/01/23 (!) 96.8 F (36 C) (Temporal)  03/06/23 98.1 F (36.7 C) (Oral)  11/22/22 (!) 97.5 F (36.4 C) (Oral)   BP Readings from Last 3 Encounters:  04/01/23 (!) 132/53  03/06/23 (!) 128/59  11/22/22 (!) 128/50   Pulse Readings from Last 3 Encounters:  04/01/23 68  03/06/23 77  11/22/22 61  Pain Assessment Pain Score: 0-No pain/10  In general this is a well appearing Caucasian male in no acute distress. He's alert and oriented x4 and appropriate throughout the examination. Cardiopulmonary assessment is negative for acute distress, and he exhibits normal effort.     KPS = 100  100 - Normal; no complaints; no evidence of disease. 90   - Able to carry on normal activity; minor signs or symptoms of disease. 80   - Normal activity with effort; some signs or symptoms of disease. 63   - Cares for self; unable to carry on normal activity or to do active work. 60   - Requires occasional assistance, but is able to care for most of his personal needs. 50   - Requires considerable assistance and frequent medical care. 40   - Disabled; requires special care and assistance. 30   - Severely disabled; hospital admission is indicated although death not imminent. 20   - Very sick; hospital admission necessary; active supportive treatment necessary. 10   - Moribund; fatal processes progressing rapidly. 0     - Dead  Karnofsky DA, Abelmann WH, Craver LS and Burchenal JH [phone]) The use of the nitrogen mustards in the palliative treatment of carcinoma: with particular reference to bronchogenic carcinoma Cancer 1 634-56  LABORATORY DATA:  Lab Results  Component Value Date   WBC 13.1 (H) 03/06/2023   HGB 12.8 (L) 03/06/2023   HCT 37.9 (L)  03/06/2023   MCV 95.9 03/06/2023   PLT 405 (H) 03/06/2023   Lab Results  Component Value Date   NA 139 11/22/2022   K 3.9 11/22/2022   CL 103 11/22/2022   CO2 28 04/11/2010   Lab Results  Component Value Date   ALT 14 04/11/2010   AST 15 04/11/2010   ALKPHOS 47 04/11/2010   BILITOT 0.8 04/11/2010     RADIOGRAPHY: NM Bone Scan Whole Body  Result Date: 03/31/2023 CLINICAL DATA:  Prostate cancer.  PSA of 8.6. EXAM: NUCLEAR MEDICINE WHOLE BODY BONE SCAN TECHNIQUE: Whole body anterior and posterior images were obtained approximately 3 hours after intravenous injection of radiopharmaceutical. RADIOPHARMACEUTICALS:  18.0 mCi Technetium-30m MDP IV COMPARISON:  Prostate MR 09/08/2022.  Abdominopelvic CT 03/27/2023. FINDINGS: Minimal tracer uptake about the knees is likely degenerative. No suspicious abnormal tracer uptake. Physiologic activity in the kidneys and bladder. IMPRESSION: No evidence of osseous metastasis. Electronically Signed   By: Jeronimo Greaves M.D.   On: 03/31/2023 13:35      IMPRESSION/PLAN: 1. 77 y.o. gentleman with Stage T1c adenocarcinoma of the prostate with Gleason Score of 3+4, and PSA of 17.2 (adjusted for finasteride). We discussed the patient's workup and outlined the nature of prostate cancer in this setting. The patient's T stage, Gleason's score, and PSA put him into the intermediate risk group. Accordingly, he is eligible for a variety of potential treatment options including brachytherapy, 5.5 weeks of external radiation, or prostatectomy. We discussed the available radiation techniques, and focused on the details and logistics of delivery.  He is not a candidate for brachytherapy with a prostate volume of 141 g and a very large median lobe.  Therefore, we discussed and outlined the risks, benefits, short and long-term effects associated with daily external beam radiotherapy and compared and contrasted these with prostatectomy. We discussed the role of SpaceOAR gel in  reducing the rectal toxicity associated with radiotherapy.  He and his wife were encouraged to ask questions that were answered to their stated satisfaction.  At the conclusion  of our conversation, the patient is interested in moving forward with 5.5 weeks of external beam therapy. We will share our discussion with Dr. Mena Goes and make arrangements for fiducial markers and SpaceOAR gel placement, first available, prior to simulation, to reduce rectal toxicity from radiotherapy. The patient appears to have a good understanding of his disease and our treatment recommendations which are of curative intent and is in agreement with the stated plan.  Therefore, we will move forward with treatment planning accordingly, in anticipation of beginning IMRT in the near future.  We personally spent 70 minutes in this encounter including chart review, reviewing radiological studies, meeting face-to-face with the patient, entering orders and completing documentation.    Marguarite Arbour, PA-C    Margaretmary Dys, MD  [facility]  Radiation Oncology Direct Dial: [phone]  Fax: [phone] [email or web address]  Skype  LinkedIn   This document serves as a record of services personally performed by Margaretmary Dys, MD and Marcello Fennel, PA-C. It was created on their behalf by Mickie Bail, a trained medical scribe. The creation of this record is based on the scribe's personal observations and the provider's statements to them. This document has been checked and approved by the attending provider.

## 2023-04-02 ENCOUNTER — Other Ambulatory Visit: Payer: Self-pay | Admitting: Urology

## 2023-04-02 MED ORDER — FLEET ENEMA 7-19 GM/118ML RE ENEM: 1.0000 | ENEMA | Freq: Once | RECTAL | Status: AC

## 2023-04-21 ENCOUNTER — Other Ambulatory Visit: Payer: Self-pay

## 2023-04-21 ENCOUNTER — Encounter (HOSPITAL_BASED_OUTPATIENT_CLINIC_OR_DEPARTMENT_OTHER): Payer: Self-pay | Admitting: Urology

## 2023-04-21 NOTE — Progress Notes (Addendum)
Spoke w/ via phone for pre-op interview---pt Lab needs dos---- I stat               Lab results------EKG 11-22-2022 epic COVID test -----patient states asymptomatic no test needed Arrive at -------600 am 05-08-2023 NPO after MN NO Solid Food.  Clear liquids from MN until---500 am Med rec completed Medications to take morning of surgery -----finasteride, amlodipine, wixela inhaler, albuterol inhaler prn/bring inhaler, rosuvastatin, tamsulosin Diabetic medication -----none day of surgery Patient instructed no nail polish to be worn day of surgery Patient instructed to bring photo id and insurance card day of surgery Patient aware to have Driver (ride ) / caregiver  [employer] sign other  for 24 hours after surgery  Patient Special Instructions -----fleets enema qhs night before surgery Pre-Op special Instructions -----none Patient verbalized understanding of instructions that were given at this phone interview. Patient denies shortness of breath, chest pain, fever, cough at this phone interview.

## 2023-05-01 NOTE — Progress Notes (Signed)
RN spoke with patient to assess any new barriers or questions prior to starting radiation treatment.  No needs at this time.

## 2023-05-07 NOTE — H&P (Signed)
CC/HPI: F/u -   1) BPH - since 2012. On tams and 5ari. PSA was 0.89 in 2020. Large prostate on [place] and CT in 2011-2013 during a bout of retention. Prostate was 150 g on November 2018 CT.   His 08/20/2016 PSA was 1.63. Prior cysto showed an intravesical median lobe. AUASS = 5, mostly satisfied. PVR 88 ml. Cysto repeated 12/03/2017 for hematuria and showed a large prostate - staged TURP vs robotic approach or HoLEP. He continued finasteride and tamsulosin. Bladder scan 120 ml. UA clear. AUASS = 2-3. No gross hematuria.   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 He is on 5ari for BPH. He had PSA elevation with his PSA in 2022 was 1.97 (3.94), Jul 2023 3.7 (7.4) and Oct 2023 PSA 4.89 (9.78). No prior bx. His brother had PCa. A Nov 2023 pMRI - a 12 mm PIRADS 4 left lateral PZ, negative staging. Prostate 141 grams. Presents for fusion. he is well with no dysuria or gross hematuria.   Biopsy:  Jan 2024  Fav Int Risk PCa -  PSA 4.89 (9.78)  T1c  Prostate 58 - 141 grams (+/- median lobe)  GG2, two cores, left, 5-10%   Staging:  Nov 2023 pMRI - a 12 mm PIRADS 4 left lateral PZ, negative staging. Prostate 141 grams.   Today, seen for the above. he is well. His Apr 2024 PSA 8.6. (17.2). AUASS = 5.     ALLERGIES: No Allergies    MEDICATIONS: Finasteride 5 mg tablet 1 tablet PO Q AM  Lisinopril 40 mg tablet  Tamsulosin Hcl 0.4 mg capsule 1 capsule PO Q HS  Albuterol Sulfate Hfa 90 mcg hfa aerosol with adapter  Amlodipine Besylate 5 mg tablet  Crestor 10 mg tablet Oral  Metformin Hcl 1,000 mg tablet Oral  Preservision Areds 2  Probiotic  Rosuvastatin Calcium 10 mg tablet  Wixela Inhub 500 mcg-50 mcg/dose blister, with inhalation device     GU PSH: Cystoscopy - 2019 Locm 300-399Mg /Ml Iodine,1Ml - 2018 Prostate Needle Biopsy - 11/22/2022       PSH Notes: Cataract Surgery, Cataract Surgery, Appendectomy   NON-GU PSH: Appendectomy - 2008 Dermatological Procedure, Left, skin cancer  removed from arm     GU PMH: Prostate Cancer, I had a long discussion with the patient using the understanding prostate cancer booklet and his path report as a reference. We discussed his stage, grade and prognosis. We discussed the nature risks and benefits of active surveillance, radical prostatectomy, external beam radiotherapy, and brachytherapy. We discussed the role of androgen deprivation and chemotherapy in prostate cancer. We also discussed other ablative techniques such as HiFU and cryotherapy as well as whole gland versus focal treatment. We discussed specifically how each treatment might affect bowel, bladder and sexual function. We discussed how each treatment might effect salvage treatments and active surveillance might lead to progression and more difficult treatment in the future. All questions answered. I will refer to Dr. Garen Lah to discuss XRT when he is ready. His wife just had breast ca and had a marker placed prior to XRT. We discussed nature r/b/a to spaceoar and gold seeds and a colleague may be doing that. His wife also just had stones and a stent placed. He wants to give it some thought, get her trhrough all this and consider tx in the future. - 12/05/2022 Elevated PSA, He will be schedule for TRUS under anesthesia with cognitive fusion. - 10/29/2022, We discussed the nature, risks and benefits  of PSA screening as well as the nature of elevated PSA (benign versus malignant). We discussed the possibility of prostate cancer and that as the PSA rises above the level of 2.5 and even over 1, the risk of prostate cancer on biopsy increases. We discussed the management of prostate cancer might include active surveillance or treatment depending on patient and cancer characteristics. In that context, we discussed the nature, risks and benefits of continued surveillance, other lab tests, transrectal ultrasound/prostate biopsy, or prostate MRI. All questions answered. Check MRI. , - 08/22/2022,  PSA elevation, - 2016 BPH w/LUTS, cont tams and finasteride. - 08/22/2022, - 05/28/2022, COnt tams and 5ari. Disc other options such as procedure. , - 05/23/2021, - 2020, - 2018, Benign prostatic hyperplasia with urinary obstruction, - 2015 Weak Urinary Stream - 08/22/2022, - 05/28/2022 (Stable), - 05/23/2021, - 2018 Encounter for Prostate Cancer screening - 05/28/2022 ED due to arterial insufficiency - 2020 Gross hematuria - 2019, - 2018, - 2018, Gross hematuria, - 2015 BPH w/o LUTS, Benign localized prostatic hyperplasia without lower urinary tract symptoms (LUTS) - 2016 Acute Cystitis/UTI, Acute cystitis without hematuria - 2014 Acute prostatitis, Prostatitis, acute - 2014 Urinary Retention, Unspec, Incomplete bladder emptying - 2014    NON-GU PMH: Encounter for general adult medical examination without abnormal findings, Encounter for preventive health examination - 2015 Personal history of other diseases of the circulatory system, History of hypertension - 2014 Personal history of other diseases of the digestive system, History of esophageal reflux - 2014 Personal history of other endocrine, nutritional and metabolic disease, History of hypercholesterolemia - 2014, History of diabetes mellitus, - 2014 Personal history of other mental and behavioral disorders, History of depression - 2014 Personal history of other specified conditions, History of heartburn - 2014 Other irritable bowel syndrome Unspecified malignant neoplasm of skin, unspecified    FAMILY HISTORY: Death In The Family Father - Father Death In The Family Mother - Mother Family Health Status Number - Runs In Family Prostate Cancer - Brother Urologic Disorder - Brother   SOCIAL HISTORY: Marital Status: Widowed Preferred Language: English; Ethnicity: Not Hispanic Or Latino; Race: White Current Smoking Status: Patient smokes.  Has never drank.  Patient's occupation is/was retired.     Notes: Tobacco Use, Occupation:,  Caffeine Use, Marital History - Widowed, Alcohol Use, 1 daughter   REVIEW OF SYSTEMS:    GU Review Male:   Patient reports get up at night to urinate, leakage of urine, and stream starts and stops. Patient denies frequent urination, hard to postpone urination, burning/ pain with urination, have to strain to urinate , erection problems, and penile pain.  Gastrointestinal (Upper):   Patient denies nausea, vomiting, and indigestion/ heartburn.  Gastrointestinal (Lower):   Patient denies diarrhea and constipation.  Constitutional:   Patient denies fever, night sweats, weight loss, and fatigue.  Skin:   Patient denies itching and skin rash/ lesion.  Eyes:   Patient denies blurred vision and double vision.  Ears/ Nose/ Throat:   Patient denies sore throat and sinus problems.  Hematologic/Lymphatic:   Patient denies swollen glands and easy bruising.  Cardiovascular:   Patient denies leg swelling and chest pains.  Respiratory:   Patient denies cough and shortness of breath.  Endocrine:   Patient denies excessive thirst.  Musculoskeletal:   Patient denies back pain and joint pain.  Neurological:   Patient denies headaches and dizziness.  Psychologic:   Patient denies depression and anxiety.   VITAL SIGNS: None   MULTI-SYSTEM PHYSICAL EXAMINATION:  Constitutional: Well-nourished. No physical deformities. Normally developed. Good grooming.  Neck: Neck symmetrical, not swollen. Normal tracheal position.  Respiratory: No labored breathing, no use of accessory muscles.   Cardiovascular: Normal temperature, normal extremity pulses, no swelling, no varicosities.  Skin: No paleness, no jaundice, no cyanosis. No lesion, no ulcer, no rash.  Neurologic / Psychiatric: Oriented to time, oriented to place, oriented to person. No depression, no anxiety, no agitation.  Gastrointestinal: No mass, no tenderness, no rigidity, non obese abdomen.     Complexity of Data:  Lab Test Review:   PSA  Records Review:    AUA Symptom Score   02/24/23 08/15/22 05/28/22 05/23/21 04/01/19 08/20/16 07/15/14 08/26/13  PSA  Total PSA 8.61 ng/mL 4.89 ng/mL 3.72 ng/mL 1.97 ng/mL 0.89 ng/mL 1.63 ng/dl 1.61  0.96   Free PSA  0.50 ng/mL        % Free PSA  10 % PSA          PROCEDURES:          Urinalysis w/Scope Dipstick Dipstick Cont'd Micro  Color: Yellow Bilirubin: Neg mg/dL WBC/hpf: NS (Not Seen)  Appearance: Clear Ketones: Neg mg/dL RBC/hpf: NS (Not Seen)  Specific Gravity: 1.025 Blood: Neg ery/uL Bacteria: NS (Not Seen)  pH: 5.5 Protein: 2+ mg/dL Cystals: Amorph Urates  Glucose: Neg mg/dL Urobilinogen: 0.2 mg/dL Casts: NS (Not Seen)    Nitrites: Neg Trichomonas: Not Present    Leukocyte Esterase: Neg leu/uL Mucous: Not Present      Epithelial Cells: 0 - 5/hpf      Yeast: NS (Not Seen)      Sperm: Not Present    ASSESSMENT:      ICD-10 Details  1 GU:   Prostate Cancer - C61 Chronic, Stable - Disc rising PSA and treatment options. he will proceed wth xrt. We also disc ADT and will use orgovyx and if not feasible - eligard.   Also disc space oar and gold seeds - nature, r/b/a and that a colleague would do the case.    PLAN:           Orders Labs BUN/Creatinine, Total Testosterone  X-Rays: Bone Scan    C.T. Abdomen/Pelvis With I.V. Contrast          Schedule Return Visit/Planned Activity: Return PRN - Office Visit, Follow up MD          Document Letter(s):  Created for Patient: Clinical Summary         Home Phone: [phone]  Regarding: rad onc plan  Discussion Occurred With:   Message: May 2024 - CT and bone scan negative for mets  Rad Onc plan 5.5 weeks EBXRT, Scheduling sheet turned in today for spaceoar and gold seed.

## 2023-05-08 ENCOUNTER — Ambulatory Visit (HOSPITAL_BASED_OUTPATIENT_CLINIC_OR_DEPARTMENT_OTHER): Payer: Medicare Other | Admitting: Certified Registered"

## 2023-05-08 ENCOUNTER — Encounter (HOSPITAL_BASED_OUTPATIENT_CLINIC_OR_DEPARTMENT_OTHER): Payer: Self-pay | Admitting: Urology

## 2023-05-08 ENCOUNTER — Other Ambulatory Visit: Payer: Self-pay

## 2023-05-08 ENCOUNTER — Encounter (HOSPITAL_BASED_OUTPATIENT_CLINIC_OR_DEPARTMENT_OTHER)
Admission: RE | Disposition: A | Discharge status: Home / Self Care | Payer: Self-pay | Source: Home / Self Care | Attending: Urology

## 2023-05-08 ENCOUNTER — Ambulatory Visit (HOSPITAL_BASED_OUTPATIENT_CLINIC_OR_DEPARTMENT_OTHER)
Admission: RE | Admit: 2023-05-08 | Discharge: 2023-05-08 | Disposition: A | Discharge status: Home / Self Care | Payer: Medicare Other | Attending: Urology | Admitting: Urology

## 2023-05-08 DIAGNOSIS — F32A Depression, unspecified: Secondary | ICD-10-CM | POA: Diagnosis not present

## 2023-05-08 DIAGNOSIS — Z7984 Long term (current) use of oral hypoglycemic drugs: Secondary | ICD-10-CM | POA: Insufficient documentation

## 2023-05-08 DIAGNOSIS — J449 Chronic obstructive pulmonary disease, unspecified: Secondary | ICD-10-CM | POA: Diagnosis not present

## 2023-05-08 DIAGNOSIS — I10 Essential (primary) hypertension: Secondary | ICD-10-CM | POA: Insufficient documentation

## 2023-05-08 DIAGNOSIS — Z79899 Other long term (current) drug therapy: Secondary | ICD-10-CM | POA: Diagnosis not present

## 2023-05-08 DIAGNOSIS — Z01818 Encounter for other preprocedural examination: Secondary | ICD-10-CM

## 2023-05-08 DIAGNOSIS — E119 Type 2 diabetes mellitus without complications: Secondary | ICD-10-CM | POA: Insufficient documentation

## 2023-05-08 DIAGNOSIS — F17210 Nicotine dependence, cigarettes, uncomplicated: Secondary | ICD-10-CM | POA: Diagnosis not present

## 2023-05-08 DIAGNOSIS — F17200 Nicotine dependence, unspecified, uncomplicated: Secondary | ICD-10-CM | POA: Insufficient documentation

## 2023-05-08 DIAGNOSIS — N529 Male erectile dysfunction, unspecified: Secondary | ICD-10-CM | POA: Insufficient documentation

## 2023-05-08 DIAGNOSIS — C61 Malignant neoplasm of prostate: Secondary | ICD-10-CM

## 2023-05-08 HISTORY — DX: Malignant neoplasm of prostate: C61

## 2023-05-08 HISTORY — PX: GOLD SEED IMPLANT: SHX6343

## 2023-05-08 HISTORY — PX: SPACE OAR INSTILLATION: SHX6769

## 2023-05-08 LAB — POCT I-STAT, CHEM 8
BUN: 17 mg/dL (ref 8–23)
Calcium, Ion: 1.22 mmol/L (ref 1.15–1.40)
Chloride: 105 mmol/L (ref 98–111)
Creatinine, Ser: 1.2 mg/dL (ref 0.61–1.24)
Glucose, Bld: 89 mg/dL (ref 70–99)
HCT: 42 % (ref 39.0–52.0)
Hemoglobin: 14.3 g/dL (ref 13.0–17.0)
Potassium: 4.3 mmol/L (ref 3.5–5.1)
Sodium: 137 mmol/L (ref 135–145)
TCO2: 22 mmol/L (ref 22–32)

## 2023-05-08 LAB — GLUCOSE, CAPILLARY: Glucose-Capillary: 96 mg/dL (ref 70–99)

## 2023-05-08 SURGERY — INSERTION, GOLD SEEDS
Anesthesia: Monitor Anesthesia Care | Site: Prostate

## 2023-05-08 MED ORDER — PROPOFOL 10 MG/ML IV BOLUS
INTRAVENOUS | Status: DC | PRN
  Administered 2023-05-08: 10 mg via INTRAVENOUS
  Administered 2023-05-08: 20 mg via INTRAVENOUS

## 2023-05-08 MED ORDER — SODIUM CHLORIDE (PF) 0.9 % IJ SOLN
INTRAMUSCULAR | Status: DC | PRN
  Administered 2023-05-08: 10 mL

## 2023-05-08 MED ORDER — ONDANSETRON HCL 4 MG/2ML IJ SOLN
INTRAMUSCULAR | Status: DC | PRN
  Administered 2023-05-08: 4 mg via INTRAVENOUS

## 2023-05-08 MED ORDER — FENTANYL CITRATE (PF) 100 MCG/2ML IJ SOLN: 25.0000 ug | INTRAMUSCULAR | Status: DC | PRN

## 2023-05-08 MED ORDER — ONDANSETRON HCL 4 MG/2ML IJ SOLN
INTRAMUSCULAR | Status: AC
  Filled 2023-05-08: qty 2

## 2023-05-08 MED ORDER — PROPOFOL 500 MG/50ML IV EMUL
INTRAVENOUS | Status: AC
  Filled 2023-05-08: qty 50

## 2023-05-08 MED ORDER — CEFAZOLIN SODIUM-DEXTROSE 2-4 GM/100ML-% IV SOLN
2.0000 g | INTRAVENOUS | Status: AC
  Administered 2023-05-08: 2 g via INTRAVENOUS

## 2023-05-08 MED ORDER — DEXMEDETOMIDINE HCL IN NACL 200 MCG/50ML IV SOLN
INTRAVENOUS | Status: DC | PRN
  Administered 2023-05-08 (×2): 4 ug via INTRAVENOUS

## 2023-05-08 MED ORDER — CEFAZOLIN SODIUM-DEXTROSE 2-4 GM/100ML-% IV SOLN
INTRAVENOUS | Status: AC
  Filled 2023-05-08: qty 100

## 2023-05-08 MED ORDER — PROPOFOL 500 MG/50ML IV EMUL
INTRAVENOUS | Status: DC | PRN
  Administered 2023-05-08: 150 ug/kg/min via INTRAVENOUS

## 2023-05-08 MED ORDER — LACTATED RINGERS IV SOLN: INTRAVENOUS | Status: DC

## 2023-05-08 MED ORDER — BUPIVACAINE HCL 0.25 % IJ SOLN
INTRAMUSCULAR | Status: DC | PRN
  Administered 2023-05-08: 10 mL

## 2023-05-08 SURGICAL SUPPLY — 27 items
BLADE CLIPPER SENSICLIP SURGIC (BLADE) ×1 IMPLANT
CNTNR URN SCR LID CUP LEK RST (MISCELLANEOUS) ×1 IMPLANT
CONT SPEC 4OZ STRL OR WHT (MISCELLANEOUS) ×1
COVER BACK TABLE 60X90IN (DRAPES) ×1 IMPLANT
DRAPE C-ARM 35X43 STRL (DRAPES) ×1 IMPLANT
DRSG TEGADERM 4X4.75 (GAUZE/BANDAGES/DRESSINGS) ×1 IMPLANT
DRSG TEGADERM 8X12 (GAUZE/BANDAGES/DRESSINGS) ×1 IMPLANT
GAUZE SPONGE 4X4 12PLY STRL (GAUZE/BANDAGES/DRESSINGS) ×1 IMPLANT
GLOVE BIO SURGEON STRL SZ7.5 (GLOVE) ×1 IMPLANT
GLOVE SURG ORTHO 8.5 STRL (GLOVE) ×1 IMPLANT
GLOVE SURG SS PI 6.5 STRL IVOR (GLOVE) IMPLANT
IMPL SPACEOAR VUE SYSTEM (Spacer) ×1 IMPLANT
IMPLANT SPACEOAR VUE SYSTEM (Spacer) ×1 IMPLANT
KIT TURNOVER CYSTO (KITS) ×1 IMPLANT
MARKER GOLD PRELOAD 1.2X3 (Urological Implant) ×1 IMPLANT
MARKER SKIN DUAL TIP RULER LAB (MISCELLANEOUS) ×1 IMPLANT
NDL SPNL 22GX3.5 QUINCKE BK (NEEDLE) ×1 IMPLANT
NEEDLE SPNL 22GX3.5 QUINCKE BK (NEEDLE) ×1 IMPLANT
SEED GOLD PRELOAD 1.2X3 (Urological Implant) ×1 IMPLANT
SHEATH ULTRASOUND LF (SHEATH) IMPLANT
SHEATH ULTRASOUND LTX NONSTRL (SHEATH) IMPLANT
SLEEVE SCD COMPRESS KNEE MED (STOCKING) ×1 IMPLANT
SURGILUBE 2OZ TUBE FLIPTOP (MISCELLANEOUS) ×1 IMPLANT
SYR 10ML LL (SYRINGE) ×1 IMPLANT
SYR CONTROL 10ML LL (SYRINGE) ×1 IMPLANT
TOWEL OR 17X24 6PK STRL BLUE (TOWEL DISPOSABLE) ×1 IMPLANT
UNDERPAD 30X36 HEAVY ABSORB (UNDERPADS AND DIAPERS) ×1 IMPLANT

## 2023-05-08 NOTE — Discharge Instructions (Addendum)
You should avoid strenuous activities today but may resume all normal activities tomorrow.  2.   You can take Tylenol as needed for any pain or discomfort.  3.    Follow up with your radiation oncologist for your simulation appointment as scheduled.  If this is not currently scheduled or you do not know the date/time for that appointment, please contact the radiation oncology office to confirm.    Post Anesthesia Home Care Instructions  Activity: Get plenty of rest for the remainder of the day. A responsible individual must stay with you for 24 hours following the procedure.  For the next 24 hours, DO NOT: -Drive a car -Advertising copywriter -Drink alcoholic beverages -Take any medication unless instructed by your physician -Make any legal decisions or sign important papers.  Meals: Start with liquid foods such as gelatin or soup. Progress to regular foods as tolerated. Avoid greasy, spicy, heavy foods. If nausea and/or vomiting occur, drink only clear liquids until the nausea and/or vomiting subsides. Call your physician if vomiting continues.  Special Instructions/Symptoms: Your throat may feel dry or sore from the anesthesia or the breathing tube placed in your throat during surgery. If this causes discomfort, gargle with warm salt water. The discomfort should disappear within 24 hours.  Call your surgeon if you experience:   1.  Fever over 101.0. 2.  Inability to urinate. 3.  Nausea and/or vomiting. 4.  Extreme swelling or bruising at the surgical site. 5.  Continued bleeding from the incision. 6.  Increased pain, redness or drainage from the incision. 7.  Problems related to your pain medication. 8.  Any problems and/or concerns. 9. Shower and remove dressing 05/08/2023.

## 2023-05-08 NOTE — Progress Notes (Signed)
Denies pain, alert and oriented, dsg site CDI. VS, color good, GCS 15

## 2023-05-08 NOTE — Transfer of Care (Signed)
Immediate Anesthesia Transfer of Care Note  Patient: Andrew Jarvis  Procedure(s) Performed: GOLD SEED IMPLANT (Prostate) SPACE OAR INSTILLATION (Prostate)  Patient Location: PACU  Anesthesia Type:MAC  Level of Consciousness: awake, alert , and oriented  Airway & Oxygen Therapy: Patient Spontanous Breathing and Patient connected to face mask oxygen  Post-op Assessment: Report given to RN and Post -op Vital signs reviewed and stable  Post vital signs: Reviewed and stable  Last Vitals:  Vitals Value Taken Time  BP 90/54 05/08/23 0830  Temp 36.4 C 05/08/23 0830  Pulse 52 05/08/23 0832  Resp 23 05/08/23 0832  SpO2 100 % 05/08/23 0832  Vitals shown include unvalidated device data.  Last Pain:  Vitals:   05/08/23 0646  TempSrc: Oral  PainSc: 0-No pain      Patients Stated Pain Goal: 5 (05/08/23 0646)  Complications: No notable events documented.

## 2023-05-08 NOTE — Op Note (Signed)
Preoperative diagnosis: Prostate cancer   Postoperative diagnosis: Prostate cancer   Procedure:  1) Fiducial marker placement into the prostate 2) Insertion of SpaceOAR hydrogel    Surgeon: Leanny Moeckel S. Rosealie Reach, Jr. M.D.   Anesthesia: IV sedation, Local anesthesia   EBL: Minimal   Complications: None   Indication: The potential risks, complications, alternative options, and expected recovery course associated with the above procedure(s) have been discussed in detail with the patient and he has provided informed consent to proceed.   Description of procedure: The patient was administered preoperative antibiotics, placed in the dorsal lithotomy position, and prepped and draped in the usual sterile fashion. A preoperative time out was performed.  Next, transrectal ultrasonography was utilized to visualize the prostate. 10 cc of 0.25% bupivacaine was then used to infiltrate the subcuateous tissue of the perineum and an additional 10 cc was injected into the lateral apical tissue surrounding the prostate for a periprostatic nerve block.  Three gold fiducial markers were then placed into the prostate via transperineal needles under ultrasound guidance at the right apex, right base, and left mid gland under direct ultrasound guidance.  A site in the midline was then selected on the perineum for placement of an 18 g needle with saline.  The needle was advanced above the rectum and below Denonvillier's fascia to the mid gland and confirmed to be in the midline on transverse imaging.  One cc of saline was injected confirming appropriate expansion of this space.  A total of 5-10 cc of saline was then injected to open the space further bilaterally.  The saline syringe was then removed and the SpaceOAR hydrogel was injected with good distribution bilaterally. He tolerated the procedure well and without complications. He was able to be awakened and transferred to the PACU in stable condition.  

## 2023-05-08 NOTE — Anesthesia Preprocedure Evaluation (Addendum)
Anesthesia Evaluation  Patient identified by MRN, date of birth, ID band Patient awake    Reviewed: Allergy & Precautions, Patient's Chart, lab work & pertinent test results  Airway Mallampati: I       Dental  (+) Teeth Intact   Pulmonary COPD,  COPD inhaler, Current Smoker and Patient abstained from smoking.    + decreased breath sounds      Cardiovascular hypertension, Pt. on medications  Rhythm:Regular Rate:Normal     Neuro/Psych  PSYCHIATRIC DISORDERS  Depression    negative neurological ROS     GI/Hepatic negative GI ROS, Neg liver ROS,,,  Endo/Other  diabetes, Type 2, Oral Hypoglycemic Agents    Renal/GU negative Renal ROS     Musculoskeletal negative musculoskeletal ROS (+)    Abdominal   Peds  Hematology negative hematology ROS (+)   Anesthesia Other Findings   Reproductive/Obstetrics                             Anesthesia Physical Anesthesia Plan  ASA: 3  Anesthesia Plan: MAC   Post-op Pain Management: Tylenol PO (pre-op)*   Induction: Intravenous  PONV Risk Score and Plan: 1 and Propofol infusion and Ondansetron  Airway Management Planned: Natural Airway and Simple Face Mask  Additional Equipment: None  Intra-op Plan:   Post-operative Plan:   Informed Consent: I have reviewed the patients History and Physical, chart, labs and discussed the procedure including the risks, benefits and alternatives for the proposed anesthesia with the patient or authorized representative who has indicated his/her understanding and acceptance.       Plan Discussed with: CRNA  Anesthesia Plan Comments:        Anesthesia Quick Evaluation

## 2023-05-08 NOTE — Anesthesia Postprocedure Evaluation (Signed)
Anesthesia Post Note  Patient: Andrew Jarvis  Procedure(s) Performed: GOLD SEED IMPLANT (Prostate) SPACE OAR INSTILLATION (Prostate)     Patient location during evaluation: PACU Anesthesia Type: MAC Level of consciousness: awake and alert Pain management: pain level controlled Vital Signs Assessment: post-procedure vital signs reviewed and stable Respiratory status: spontaneous breathing, nonlabored ventilation, respiratory function stable and patient connected to nasal cannula oxygen Cardiovascular status: stable and blood pressure returned to baseline Postop Assessment: no apparent nausea or vomiting Anesthetic complications: no  No notable events documented.  Last Vitals:  Vitals:   05/08/23 0900 05/08/23 0929  BP: (!) 114/50 (!) 123/49  Pulse: (!) 50 (!) 52  Resp: 20 16  Temp:  36.4 C  SpO2: 100% 98%    Last Pain:  Vitals:   05/08/23 0929  TempSrc:   PainSc: 0-No pain                 Andrew Jarvis

## 2023-05-09 ENCOUNTER — Encounter (HOSPITAL_BASED_OUTPATIENT_CLINIC_OR_DEPARTMENT_OTHER): Payer: Self-pay | Admitting: Urology

## 2023-05-14 ENCOUNTER — Telehealth: Payer: Self-pay | Admitting: *Deleted

## 2023-05-14 NOTE — Telephone Encounter (Signed)
Called patient to remind of sim appt. for 05-16-23- arrival time- 2:45 pm @ [facility], informed patient to arrive with a full bladder, spoke with patient and he is aware of this appt. and the instructions

## 2023-05-16 ENCOUNTER — Ambulatory Visit
Admission: RE | Admit: 2023-05-16 | Discharge: 2023-05-16 | Disposition: A | Discharge status: Home / Self Care | Payer: Medicare Other | Source: Ambulatory Visit | Attending: Radiation Oncology | Admitting: Radiation Oncology

## 2023-05-16 ENCOUNTER — Other Ambulatory Visit: Payer: Self-pay

## 2023-05-16 DIAGNOSIS — C61 Malignant neoplasm of prostate: Secondary | ICD-10-CM | POA: Insufficient documentation

## 2023-05-16 DIAGNOSIS — Z510 Encounter for antineoplastic radiation therapy: Secondary | ICD-10-CM | POA: Diagnosis not present

## 2023-05-16 DIAGNOSIS — Z191 Hormone sensitive malignancy status: Secondary | ICD-10-CM | POA: Diagnosis not present

## 2023-05-18 NOTE — Progress Notes (Signed)
  Radiation Oncology         [phone] ________________________________  Name: Andrew Jarvis MRN: [id number]  Date: 05/16/2023  DOB: August 30, 1946  SIMULATION AND TREATMENT PLANNING NOTE    ICD-10-CM   1. Malignant neoplasm of prostate (HCC)  C61       DIAGNOSIS:  76 y.o. gentleman with Stage T1c adenocarcinoma of the prostate with Gleason score of 3+4, and PSA of 17.2 (adjusted for finasteride).  NARRATIVE:  The patient was brought to the CT Simulation planning suite.  Identity was confirmed.  All relevant records and images related to the planned course of therapy were reviewed.  The patient freely provided informed written consent to proceed with treatment after reviewing the details related to the planned course of therapy. The consent form was witnessed and verified by the simulation staff.  Then, the patient was set-up in a stable reproducible supine position for radiation therapy.  A vacuum lock pillow device was custom fabricated to position his legs in a reproducible immobilized position.  Then, supervised the performance of a urethrogram under sterile conditions to identify the prostatic apex.  CT images were obtained.  Surface markings were placed.  The CT images were loaded into the planning software.  Then the prostate target and avoidance structures including the rectum, bladder, bowel and hips were contoured.  Treatment planning then occurred.  The radiation prescription was entered and confirmed.  A total of one complex treatment devices was fabricated. I have requested : Intensity Modulated Radiotherapy (IMRT) is medically necessary for this case for the following reason:  Rectal sparing.  I have requested daily cone beam CT volumetric image gudiance to track gold fiducial posiitoning along with bladder and rectal filling, this is medically necessary to assure accurate positioning of high dose radiation.  PLAN:  The patient will receive 70 Gy in 28  fractions.  ________________________________  Artist Pais Kathrynn Running, M.D.

## 2023-05-21 DIAGNOSIS — Z510 Encounter for antineoplastic radiation therapy: Secondary | ICD-10-CM | POA: Diagnosis not present

## 2023-05-21 DIAGNOSIS — Z191 Hormone sensitive malignancy status: Secondary | ICD-10-CM | POA: Diagnosis not present

## 2023-05-27 DIAGNOSIS — I77811 Abdominal aortic ectasia: Secondary | ICD-10-CM | POA: Diagnosis not present

## 2023-05-27 DIAGNOSIS — E46 Unspecified protein-calorie malnutrition: Secondary | ICD-10-CM | POA: Diagnosis not present

## 2023-05-27 DIAGNOSIS — Z79899 Other long term (current) drug therapy: Secondary | ICD-10-CM | POA: Diagnosis not present

## 2023-05-27 DIAGNOSIS — I10 Essential (primary) hypertension: Secondary | ICD-10-CM | POA: Diagnosis not present

## 2023-05-27 DIAGNOSIS — I700 Atherosclerosis of aorta: Secondary | ICD-10-CM | POA: Diagnosis not present

## 2023-05-27 DIAGNOSIS — J449 Chronic obstructive pulmonary disease, unspecified: Secondary | ICD-10-CM | POA: Diagnosis not present

## 2023-05-27 DIAGNOSIS — E7800 Pure hypercholesterolemia, unspecified: Secondary | ICD-10-CM | POA: Diagnosis not present

## 2023-05-27 DIAGNOSIS — E1151 Type 2 diabetes mellitus with diabetic peripheral angiopathy without gangrene: Secondary | ICD-10-CM | POA: Diagnosis not present

## 2023-06-02 ENCOUNTER — Other Ambulatory Visit: Payer: Self-pay

## 2023-06-02 ENCOUNTER — Ambulatory Visit: Admission: RE | Admit: 2023-06-02 | Payer: Medicare Other | Source: Ambulatory Visit

## 2023-06-02 DIAGNOSIS — Z510 Encounter for antineoplastic radiation therapy: Secondary | ICD-10-CM | POA: Diagnosis not present

## 2023-06-02 DIAGNOSIS — Z191 Hormone sensitive malignancy status: Secondary | ICD-10-CM | POA: Diagnosis not present

## 2023-06-02 LAB — RAD ONC ARIA SESSION SUMMARY
Course Elapsed Days: 0
Plan Fractions Treated to Date: 1
Plan Prescribed Dose Per Fraction: 2.5 Gy
Plan Total Fractions Prescribed: 28
Plan Total Prescribed Dose: 70 Gy
Reference Point Dosage Given to Date: 2.5 Gy
Reference Point Session Dosage Given: 2.5 Gy
Session Number: 1

## 2023-06-03 ENCOUNTER — Ambulatory Visit
Admission: RE | Admit: 2023-06-03 | Discharge: 2023-06-03 | Disposition: A | Discharge status: Home / Self Care | Payer: Medicare Other | Source: Ambulatory Visit | Attending: Radiation Oncology | Admitting: Radiation Oncology

## 2023-06-03 ENCOUNTER — Other Ambulatory Visit: Payer: Self-pay

## 2023-06-03 DIAGNOSIS — Z510 Encounter for antineoplastic radiation therapy: Secondary | ICD-10-CM | POA: Diagnosis not present

## 2023-06-03 DIAGNOSIS — Z191 Hormone sensitive malignancy status: Secondary | ICD-10-CM | POA: Diagnosis not present

## 2023-06-03 LAB — RAD ONC ARIA SESSION SUMMARY
Course Elapsed Days: 1
Plan Fractions Treated to Date: 2
Plan Prescribed Dose Per Fraction: 2.5 Gy
Plan Total Fractions Prescribed: 28
Plan Total Prescribed Dose: 70 Gy
Reference Point Dosage Given to Date: 5 Gy
Reference Point Session Dosage Given: 2.5 Gy
Session Number: 2

## 2023-06-04 ENCOUNTER — Other Ambulatory Visit: Payer: Self-pay | Admitting: Family Medicine

## 2023-06-04 ENCOUNTER — Ambulatory Visit
Admission: RE | Admit: 2023-06-04 | Discharge: 2023-06-04 | Disposition: A | Discharge status: Home / Self Care | Payer: Medicare Other | Source: Ambulatory Visit | Attending: Radiation Oncology | Admitting: Radiation Oncology

## 2023-06-04 ENCOUNTER — Other Ambulatory Visit: Payer: Self-pay

## 2023-06-04 DIAGNOSIS — Z191 Hormone sensitive malignancy status: Secondary | ICD-10-CM | POA: Diagnosis not present

## 2023-06-04 DIAGNOSIS — Z510 Encounter for antineoplastic radiation therapy: Secondary | ICD-10-CM | POA: Diagnosis not present

## 2023-06-04 DIAGNOSIS — I77811 Abdominal aortic ectasia: Secondary | ICD-10-CM

## 2023-06-04 LAB — RAD ONC ARIA SESSION SUMMARY
Course Elapsed Days: 2
Plan Fractions Treated to Date: 3
Plan Prescribed Dose Per Fraction: 2.5 Gy
Plan Total Fractions Prescribed: 28
Plan Total Prescribed Dose: 70 Gy
Reference Point Dosage Given to Date: 7.5 Gy
Reference Point Session Dosage Given: 2.5 Gy
Session Number: 3

## 2023-06-05 ENCOUNTER — Ambulatory Visit
Admission: RE | Admit: 2023-06-05 | Discharge: 2023-06-05 | Disposition: A | Discharge status: Home / Self Care | Payer: Medicare Other | Source: Ambulatory Visit | Attending: Radiation Oncology | Admitting: Radiation Oncology

## 2023-06-05 ENCOUNTER — Other Ambulatory Visit: Payer: Self-pay

## 2023-06-05 DIAGNOSIS — Z191 Hormone sensitive malignancy status: Secondary | ICD-10-CM | POA: Diagnosis not present

## 2023-06-05 DIAGNOSIS — Z510 Encounter for antineoplastic radiation therapy: Secondary | ICD-10-CM | POA: Diagnosis not present

## 2023-06-05 LAB — RAD ONC ARIA SESSION SUMMARY
Course Elapsed Days: 3
Plan Fractions Treated to Date: 4
Plan Prescribed Dose Per Fraction: 2.5 Gy
Plan Total Fractions Prescribed: 28
Plan Total Prescribed Dose: 70 Gy
Reference Point Dosage Given to Date: 10 Gy
Reference Point Session Dosage Given: 2.5 Gy
Session Number: 4

## 2023-06-06 ENCOUNTER — Ambulatory Visit
Admission: RE | Admit: 2023-06-06 | Discharge: 2023-06-06 | Disposition: A | Discharge status: Home / Self Care | Payer: Medicare Other | Source: Ambulatory Visit | Attending: Radiation Oncology | Admitting: Radiation Oncology

## 2023-06-06 ENCOUNTER — Other Ambulatory Visit: Payer: Self-pay

## 2023-06-06 ENCOUNTER — Ambulatory Visit: Admission: RE | Admit: 2023-06-06 | Payer: Medicare Other | Source: Ambulatory Visit

## 2023-06-06 DIAGNOSIS — Z191 Hormone sensitive malignancy status: Secondary | ICD-10-CM | POA: Diagnosis not present

## 2023-06-06 DIAGNOSIS — Z510 Encounter for antineoplastic radiation therapy: Secondary | ICD-10-CM | POA: Diagnosis not present

## 2023-06-06 LAB — RAD ONC ARIA SESSION SUMMARY
Course Elapsed Days: 4
Plan Fractions Treated to Date: 5
Plan Prescribed Dose Per Fraction: 2.5 Gy
Plan Total Fractions Prescribed: 28
Plan Total Prescribed Dose: 70 Gy
Reference Point Dosage Given to Date: 12.5 Gy
Reference Point Session Dosage Given: 2.5 Gy
Session Number: 5

## 2023-06-09 ENCOUNTER — Other Ambulatory Visit: Payer: Self-pay

## 2023-06-09 ENCOUNTER — Ambulatory Visit
Admission: RE | Admit: 2023-06-09 | Discharge: 2023-06-09 | Disposition: A | Discharge status: Home / Self Care | Payer: Medicare Other | Source: Ambulatory Visit | Attending: Radiation Oncology | Admitting: Radiation Oncology

## 2023-06-09 DIAGNOSIS — Z510 Encounter for antineoplastic radiation therapy: Secondary | ICD-10-CM | POA: Diagnosis not present

## 2023-06-09 DIAGNOSIS — Z191 Hormone sensitive malignancy status: Secondary | ICD-10-CM | POA: Diagnosis not present

## 2023-06-09 LAB — RAD ONC ARIA SESSION SUMMARY
Course Elapsed Days: 7
Plan Fractions Treated to Date: 6
Plan Prescribed Dose Per Fraction: 2.5 Gy
Plan Total Fractions Prescribed: 28
Plan Total Prescribed Dose: 70 Gy
Reference Point Dosage Given to Date: 15 Gy
Reference Point Session Dosage Given: 2.5 Gy
Session Number: 6

## 2023-06-10 ENCOUNTER — Other Ambulatory Visit: Payer: Self-pay

## 2023-06-10 ENCOUNTER — Ambulatory Visit
Admission: RE | Admit: 2023-06-10 | Discharge: 2023-06-10 | Disposition: A | Discharge status: Home / Self Care | Payer: Medicare Other | Source: Ambulatory Visit | Attending: Radiation Oncology | Admitting: Radiation Oncology

## 2023-06-10 DIAGNOSIS — Z191 Hormone sensitive malignancy status: Secondary | ICD-10-CM | POA: Diagnosis not present

## 2023-06-10 DIAGNOSIS — Z510 Encounter for antineoplastic radiation therapy: Secondary | ICD-10-CM | POA: Diagnosis not present

## 2023-06-10 LAB — RAD ONC ARIA SESSION SUMMARY
Course Elapsed Days: 8
Plan Fractions Treated to Date: 7
Plan Prescribed Dose Per Fraction: 2.5 Gy
Plan Total Fractions Prescribed: 28
Plan Total Prescribed Dose: 70 Gy
Reference Point Dosage Given to Date: 17.5 Gy
Reference Point Session Dosage Given: 2.5 Gy
Session Number: 7

## 2023-06-11 ENCOUNTER — Other Ambulatory Visit: Payer: Self-pay

## 2023-06-11 ENCOUNTER — Ambulatory Visit: Admission: RE | Admit: 2023-06-11 | Payer: Medicare Other | Source: Ambulatory Visit

## 2023-06-11 DIAGNOSIS — Z510 Encounter for antineoplastic radiation therapy: Secondary | ICD-10-CM | POA: Diagnosis not present

## 2023-06-11 DIAGNOSIS — Z191 Hormone sensitive malignancy status: Secondary | ICD-10-CM | POA: Diagnosis not present

## 2023-06-11 LAB — RAD ONC ARIA SESSION SUMMARY
Course Elapsed Days: 9
Plan Fractions Treated to Date: 8
Plan Prescribed Dose Per Fraction: 2.5 Gy
Plan Total Fractions Prescribed: 28
Plan Total Prescribed Dose: 70 Gy
Reference Point Dosage Given to Date: 20 Gy
Reference Point Session Dosage Given: 2.5 Gy
Session Number: 8

## 2023-06-12 ENCOUNTER — Ambulatory Visit
Admission: RE | Admit: 2023-06-12 | Discharge: 2023-06-12 | Disposition: A | Discharge status: Home / Self Care | Payer: Medicare Other | Source: Ambulatory Visit | Attending: Radiation Oncology | Admitting: Radiation Oncology

## 2023-06-12 ENCOUNTER — Other Ambulatory Visit: Payer: Self-pay

## 2023-06-12 DIAGNOSIS — C61 Malignant neoplasm of prostate: Secondary | ICD-10-CM | POA: Insufficient documentation

## 2023-06-12 DIAGNOSIS — Z510 Encounter for antineoplastic radiation therapy: Secondary | ICD-10-CM | POA: Diagnosis not present

## 2023-06-12 DIAGNOSIS — Z191 Hormone sensitive malignancy status: Secondary | ICD-10-CM | POA: Diagnosis not present

## 2023-06-12 LAB — RAD ONC ARIA SESSION SUMMARY
Course Elapsed Days: 10
Plan Fractions Treated to Date: 9
Plan Prescribed Dose Per Fraction: 2.5 Gy
Plan Total Fractions Prescribed: 28
Plan Total Prescribed Dose: 70 Gy
Reference Point Dosage Given to Date: 22.5 Gy
Reference Point Session Dosage Given: 2.5 Gy
Session Number: 9

## 2023-06-13 ENCOUNTER — Other Ambulatory Visit: Payer: Self-pay

## 2023-06-13 ENCOUNTER — Ambulatory Visit: Admission: RE | Admit: 2023-06-13 | Payer: Medicare Other | Source: Ambulatory Visit

## 2023-06-13 ENCOUNTER — Ambulatory Visit
Admission: RE | Admit: 2023-06-13 | Discharge: 2023-06-13 | Disposition: A | Discharge status: Home / Self Care | Payer: Medicare Other | Source: Ambulatory Visit | Attending: Radiation Oncology | Admitting: Radiation Oncology

## 2023-06-13 DIAGNOSIS — Z191 Hormone sensitive malignancy status: Secondary | ICD-10-CM | POA: Diagnosis not present

## 2023-06-13 DIAGNOSIS — Z510 Encounter for antineoplastic radiation therapy: Secondary | ICD-10-CM | POA: Diagnosis not present

## 2023-06-13 LAB — RAD ONC ARIA SESSION SUMMARY
Course Elapsed Days: 11
Plan Fractions Treated to Date: 10
Plan Prescribed Dose Per Fraction: 2.5 Gy
Plan Total Fractions Prescribed: 28
Plan Total Prescribed Dose: 70 Gy
Reference Point Dosage Given to Date: 25 Gy
Reference Point Session Dosage Given: 2.5 Gy
Session Number: 10

## 2023-06-16 ENCOUNTER — Ambulatory Visit
Admission: RE | Admit: 2023-06-16 | Discharge: 2023-06-16 | Disposition: A | Discharge status: Home / Self Care | Payer: Medicare Other | Source: Ambulatory Visit | Attending: Radiation Oncology | Admitting: Radiation Oncology

## 2023-06-16 ENCOUNTER — Other Ambulatory Visit: Payer: Self-pay

## 2023-06-16 DIAGNOSIS — Z191 Hormone sensitive malignancy status: Secondary | ICD-10-CM | POA: Diagnosis not present

## 2023-06-16 DIAGNOSIS — Z510 Encounter for antineoplastic radiation therapy: Secondary | ICD-10-CM | POA: Diagnosis not present

## 2023-06-16 LAB — RAD ONC ARIA SESSION SUMMARY
Course Elapsed Days: 14
Plan Fractions Treated to Date: 11
Plan Prescribed Dose Per Fraction: 2.5 Gy
Plan Total Fractions Prescribed: 28
Plan Total Prescribed Dose: 70 Gy
Reference Point Dosage Given to Date: 27.5 Gy
Reference Point Session Dosage Given: 2.5 Gy
Session Number: 11

## 2023-06-17 ENCOUNTER — Other Ambulatory Visit: Payer: Self-pay

## 2023-06-17 ENCOUNTER — Ambulatory Visit
Admission: RE | Admit: 2023-06-17 | Discharge: 2023-06-17 | Disposition: A | Discharge status: Home / Self Care | Payer: Medicare Other | Source: Ambulatory Visit | Attending: Radiation Oncology | Admitting: Radiation Oncology

## 2023-06-17 DIAGNOSIS — Z510 Encounter for antineoplastic radiation therapy: Secondary | ICD-10-CM | POA: Diagnosis not present

## 2023-06-17 DIAGNOSIS — Z191 Hormone sensitive malignancy status: Secondary | ICD-10-CM | POA: Diagnosis not present

## 2023-06-17 LAB — RAD ONC ARIA SESSION SUMMARY
Course Elapsed Days: 15
Plan Fractions Treated to Date: 12
Plan Prescribed Dose Per Fraction: 2.5 Gy
Plan Total Fractions Prescribed: 28
Plan Total Prescribed Dose: 70 Gy
Reference Point Dosage Given to Date: 30 Gy
Reference Point Session Dosage Given: 2.5 Gy
Session Number: 12

## 2023-06-18 ENCOUNTER — Ambulatory Visit
Admission: RE | Admit: 2023-06-18 | Discharge: 2023-06-18 | Disposition: A | Discharge status: Home / Self Care | Payer: Medicare Other | Source: Ambulatory Visit | Attending: Radiation Oncology | Admitting: Radiation Oncology

## 2023-06-18 ENCOUNTER — Other Ambulatory Visit: Payer: Self-pay

## 2023-06-18 DIAGNOSIS — Z510 Encounter for antineoplastic radiation therapy: Secondary | ICD-10-CM | POA: Diagnosis not present

## 2023-06-18 DIAGNOSIS — Z191 Hormone sensitive malignancy status: Secondary | ICD-10-CM | POA: Diagnosis not present

## 2023-06-18 LAB — RAD ONC ARIA SESSION SUMMARY
Course Elapsed Days: 16
Plan Fractions Treated to Date: 13
Plan Prescribed Dose Per Fraction: 2.5 Gy
Plan Total Fractions Prescribed: 28
Plan Total Prescribed Dose: 70 Gy
Reference Point Dosage Given to Date: 32.5 Gy
Reference Point Session Dosage Given: 2.5 Gy
Session Number: 13

## 2023-06-19 ENCOUNTER — Ambulatory Visit
Admission: RE | Admit: 2023-06-19 | Discharge: 2023-06-19 | Disposition: A | Discharge status: Home / Self Care | Payer: Medicare Other | Source: Ambulatory Visit | Attending: Radiation Oncology | Admitting: Radiation Oncology

## 2023-06-19 ENCOUNTER — Other Ambulatory Visit: Payer: Self-pay

## 2023-06-19 DIAGNOSIS — Z191 Hormone sensitive malignancy status: Secondary | ICD-10-CM | POA: Diagnosis not present

## 2023-06-19 DIAGNOSIS — Z510 Encounter for antineoplastic radiation therapy: Secondary | ICD-10-CM | POA: Diagnosis not present

## 2023-06-19 LAB — RAD ONC ARIA SESSION SUMMARY
Course Elapsed Days: 17
Plan Fractions Treated to Date: 14
Plan Prescribed Dose Per Fraction: 2.5 Gy
Plan Total Fractions Prescribed: 28
Plan Total Prescribed Dose: 70 Gy
Reference Point Dosage Given to Date: 35 Gy
Reference Point Session Dosage Given: 2.5 Gy
Session Number: 14

## 2023-06-20 ENCOUNTER — Other Ambulatory Visit: Payer: Self-pay

## 2023-06-20 ENCOUNTER — Ambulatory Visit
Admission: RE | Admit: 2023-06-20 | Discharge: 2023-06-20 | Disposition: A | Discharge status: Home / Self Care | Payer: Medicare Other | Source: Ambulatory Visit | Attending: Family Medicine | Admitting: Family Medicine

## 2023-06-20 ENCOUNTER — Ambulatory Visit
Admission: RE | Admit: 2023-06-20 | Discharge: 2023-06-20 | Disposition: A | Discharge status: Home / Self Care | Payer: Medicare Other | Source: Ambulatory Visit | Attending: Radiation Oncology | Admitting: Radiation Oncology

## 2023-06-20 DIAGNOSIS — I77811 Abdominal aortic ectasia: Secondary | ICD-10-CM | POA: Diagnosis not present

## 2023-06-20 DIAGNOSIS — Z191 Hormone sensitive malignancy status: Secondary | ICD-10-CM | POA: Diagnosis not present

## 2023-06-20 DIAGNOSIS — Z510 Encounter for antineoplastic radiation therapy: Secondary | ICD-10-CM | POA: Diagnosis not present

## 2023-06-20 LAB — RAD ONC ARIA SESSION SUMMARY
Course Elapsed Days: 18
Plan Fractions Treated to Date: 15
Plan Prescribed Dose Per Fraction: 2.5 Gy
Plan Total Fractions Prescribed: 28
Plan Total Prescribed Dose: 70 Gy
Reference Point Dosage Given to Date: 37.5 Gy
Reference Point Session Dosage Given: 2.5 Gy
Session Number: 15

## 2023-06-23 ENCOUNTER — Ambulatory Visit
Admission: RE | Admit: 2023-06-23 | Discharge: 2023-06-23 | Disposition: A | Discharge status: Home / Self Care | Payer: Medicare Other | Source: Ambulatory Visit | Attending: Radiation Oncology | Admitting: Radiation Oncology

## 2023-06-23 ENCOUNTER — Other Ambulatory Visit: Payer: Self-pay

## 2023-06-23 DIAGNOSIS — Z510 Encounter for antineoplastic radiation therapy: Secondary | ICD-10-CM | POA: Diagnosis not present

## 2023-06-23 DIAGNOSIS — Z191 Hormone sensitive malignancy status: Secondary | ICD-10-CM | POA: Diagnosis not present

## 2023-06-23 LAB — RAD ONC ARIA SESSION SUMMARY
Course Elapsed Days: 21
Plan Fractions Treated to Date: 16
Plan Prescribed Dose Per Fraction: 2.5 Gy
Plan Total Fractions Prescribed: 28
Plan Total Prescribed Dose: 70 Gy
Reference Point Dosage Given to Date: 40 Gy
Reference Point Session Dosage Given: 2.5 Gy
Session Number: 16

## 2023-06-24 ENCOUNTER — Ambulatory Visit: Admission: RE | Admit: 2023-06-24 | Payer: Medicare Other | Source: Ambulatory Visit

## 2023-06-24 ENCOUNTER — Other Ambulatory Visit: Payer: Self-pay

## 2023-06-24 DIAGNOSIS — Z510 Encounter for antineoplastic radiation therapy: Secondary | ICD-10-CM | POA: Diagnosis not present

## 2023-06-24 DIAGNOSIS — Z191 Hormone sensitive malignancy status: Secondary | ICD-10-CM | POA: Diagnosis not present

## 2023-06-24 LAB — RAD ONC ARIA SESSION SUMMARY
Course Elapsed Days: 22
Plan Fractions Treated to Date: 17
Plan Prescribed Dose Per Fraction: 2.5 Gy
Plan Total Fractions Prescribed: 28
Plan Total Prescribed Dose: 70 Gy
Reference Point Dosage Given to Date: 42.5 Gy
Reference Point Session Dosage Given: 2.5 Gy
Session Number: 17

## 2023-06-25 ENCOUNTER — Other Ambulatory Visit: Payer: Self-pay

## 2023-06-25 ENCOUNTER — Ambulatory Visit
Admission: RE | Admit: 2023-06-25 | Discharge: 2023-06-25 | Disposition: A | Discharge status: Home / Self Care | Payer: Medicare Other | Source: Ambulatory Visit | Attending: Radiation Oncology | Admitting: Radiation Oncology

## 2023-06-25 DIAGNOSIS — Z191 Hormone sensitive malignancy status: Secondary | ICD-10-CM | POA: Diagnosis not present

## 2023-06-25 DIAGNOSIS — Z510 Encounter for antineoplastic radiation therapy: Secondary | ICD-10-CM | POA: Diagnosis not present

## 2023-06-25 LAB — RAD ONC ARIA SESSION SUMMARY
Course Elapsed Days: 23
Plan Fractions Treated to Date: 18
Plan Prescribed Dose Per Fraction: 2.5 Gy
Plan Total Fractions Prescribed: 28
Plan Total Prescribed Dose: 70 Gy
Reference Point Dosage Given to Date: 45 Gy
Reference Point Session Dosage Given: 2.5 Gy
Session Number: 18

## 2023-06-26 ENCOUNTER — Other Ambulatory Visit: Payer: Self-pay

## 2023-06-26 ENCOUNTER — Ambulatory Visit
Admission: RE | Admit: 2023-06-26 | Discharge: 2023-06-26 | Disposition: A | Discharge status: Home / Self Care | Payer: Medicare Other | Source: Ambulatory Visit | Attending: Radiation Oncology | Admitting: Radiation Oncology

## 2023-06-26 DIAGNOSIS — Z510 Encounter for antineoplastic radiation therapy: Secondary | ICD-10-CM | POA: Diagnosis not present

## 2023-06-26 DIAGNOSIS — Z191 Hormone sensitive malignancy status: Secondary | ICD-10-CM | POA: Diagnosis not present

## 2023-06-26 LAB — RAD ONC ARIA SESSION SUMMARY
Course Elapsed Days: 24
Plan Fractions Treated to Date: 19
Plan Prescribed Dose Per Fraction: 2.5 Gy
Plan Total Fractions Prescribed: 28
Plan Total Prescribed Dose: 70 Gy
Reference Point Dosage Given to Date: 47.5 Gy
Reference Point Session Dosage Given: 2.5 Gy
Session Number: 19

## 2023-06-27 ENCOUNTER — Ambulatory Visit
Admission: RE | Admit: 2023-06-27 | Discharge: 2023-06-27 | Disposition: A | Discharge status: Home / Self Care | Payer: Medicare Other | Source: Ambulatory Visit | Attending: Radiation Oncology | Admitting: Radiation Oncology

## 2023-06-27 ENCOUNTER — Other Ambulatory Visit: Payer: Self-pay

## 2023-06-27 ENCOUNTER — Ambulatory Visit: Admission: RE | Admit: 2023-06-27 | Payer: Medicare Other | Source: Ambulatory Visit

## 2023-06-27 DIAGNOSIS — Z510 Encounter for antineoplastic radiation therapy: Secondary | ICD-10-CM | POA: Diagnosis not present

## 2023-06-27 DIAGNOSIS — Z191 Hormone sensitive malignancy status: Secondary | ICD-10-CM | POA: Diagnosis not present

## 2023-06-27 LAB — RAD ONC ARIA SESSION SUMMARY
Course Elapsed Days: 25
Plan Fractions Treated to Date: 20
Plan Prescribed Dose Per Fraction: 2.5 Gy
Plan Total Fractions Prescribed: 28
Plan Total Prescribed Dose: 70 Gy
Reference Point Dosage Given to Date: 50 Gy
Reference Point Session Dosage Given: 2.5 Gy
Session Number: 20

## 2023-06-30 ENCOUNTER — Other Ambulatory Visit: Payer: Self-pay

## 2023-06-30 ENCOUNTER — Ambulatory Visit: Admission: RE | Admit: 2023-06-30 | Payer: Medicare Other | Source: Ambulatory Visit

## 2023-06-30 DIAGNOSIS — Z191 Hormone sensitive malignancy status: Secondary | ICD-10-CM | POA: Diagnosis not present

## 2023-06-30 DIAGNOSIS — Z510 Encounter for antineoplastic radiation therapy: Secondary | ICD-10-CM | POA: Diagnosis not present

## 2023-06-30 LAB — RAD ONC ARIA SESSION SUMMARY
Course Elapsed Days: 28
Plan Fractions Treated to Date: 21
Plan Prescribed Dose Per Fraction: 2.5 Gy
Plan Total Fractions Prescribed: 28
Plan Total Prescribed Dose: 70 Gy
Reference Point Dosage Given to Date: 52.5 Gy
Reference Point Session Dosage Given: 2.5 Gy
Session Number: 21

## 2023-07-01 ENCOUNTER — Other Ambulatory Visit: Payer: Self-pay

## 2023-07-01 ENCOUNTER — Ambulatory Visit
Admission: RE | Admit: 2023-07-01 | Discharge: 2023-07-01 | Disposition: A | Discharge status: Home / Self Care | Payer: Medicare Other | Source: Ambulatory Visit | Attending: Radiation Oncology | Admitting: Radiation Oncology

## 2023-07-01 DIAGNOSIS — Z191 Hormone sensitive malignancy status: Secondary | ICD-10-CM | POA: Diagnosis not present

## 2023-07-01 DIAGNOSIS — Z510 Encounter for antineoplastic radiation therapy: Secondary | ICD-10-CM | POA: Diagnosis not present

## 2023-07-01 LAB — RAD ONC ARIA SESSION SUMMARY
Course Elapsed Days: 29
Plan Fractions Treated to Date: 22
Plan Prescribed Dose Per Fraction: 2.5 Gy
Plan Total Fractions Prescribed: 28
Plan Total Prescribed Dose: 70 Gy
Reference Point Dosage Given to Date: 55 Gy
Reference Point Session Dosage Given: 2.5 Gy
Session Number: 22

## 2023-07-02 ENCOUNTER — Other Ambulatory Visit: Payer: Self-pay

## 2023-07-02 ENCOUNTER — Ambulatory Visit: Admission: RE | Admit: 2023-07-02 | Payer: Medicare Other | Source: Ambulatory Visit

## 2023-07-02 DIAGNOSIS — Z191 Hormone sensitive malignancy status: Secondary | ICD-10-CM | POA: Diagnosis not present

## 2023-07-02 DIAGNOSIS — Z510 Encounter for antineoplastic radiation therapy: Secondary | ICD-10-CM | POA: Diagnosis not present

## 2023-07-02 LAB — RAD ONC ARIA SESSION SUMMARY
Course Elapsed Days: 30
Plan Fractions Treated to Date: 23
Plan Prescribed Dose Per Fraction: 2.5 Gy
Plan Total Fractions Prescribed: 28
Plan Total Prescribed Dose: 70 Gy
Reference Point Dosage Given to Date: 57.5 Gy
Reference Point Session Dosage Given: 2.5 Gy
Session Number: 23

## 2023-07-03 ENCOUNTER — Other Ambulatory Visit: Payer: Self-pay

## 2023-07-03 ENCOUNTER — Ambulatory Visit
Admission: RE | Admit: 2023-07-03 | Discharge: 2023-07-03 | Disposition: A | Discharge status: Home / Self Care | Payer: Medicare Other | Source: Ambulatory Visit | Attending: Radiation Oncology | Admitting: Radiation Oncology

## 2023-07-03 DIAGNOSIS — Z191 Hormone sensitive malignancy status: Secondary | ICD-10-CM | POA: Diagnosis not present

## 2023-07-03 DIAGNOSIS — Z510 Encounter for antineoplastic radiation therapy: Secondary | ICD-10-CM | POA: Diagnosis not present

## 2023-07-03 LAB — RAD ONC ARIA SESSION SUMMARY
Course Elapsed Days: 31
Plan Fractions Treated to Date: 24
Plan Prescribed Dose Per Fraction: 2.5 Gy
Plan Total Fractions Prescribed: 28
Plan Total Prescribed Dose: 70 Gy
Reference Point Dosage Given to Date: 60 Gy
Reference Point Session Dosage Given: 2.5 Gy
Session Number: 24

## 2023-07-04 ENCOUNTER — Ambulatory Visit: Admission: RE | Admit: 2023-07-04 | Payer: Medicare Other | Source: Ambulatory Visit

## 2023-07-04 ENCOUNTER — Other Ambulatory Visit: Payer: Self-pay

## 2023-07-04 DIAGNOSIS — Z510 Encounter for antineoplastic radiation therapy: Secondary | ICD-10-CM | POA: Diagnosis not present

## 2023-07-04 DIAGNOSIS — Z191 Hormone sensitive malignancy status: Secondary | ICD-10-CM | POA: Diagnosis not present

## 2023-07-04 LAB — RAD ONC ARIA SESSION SUMMARY
Course Elapsed Days: 32
Plan Fractions Treated to Date: 25
Plan Prescribed Dose Per Fraction: 2.5 Gy
Plan Total Fractions Prescribed: 28
Plan Total Prescribed Dose: 70 Gy
Reference Point Dosage Given to Date: 62.5 Gy
Reference Point Session Dosage Given: 2.5 Gy
Session Number: 25

## 2023-07-07 ENCOUNTER — Other Ambulatory Visit: Payer: Self-pay

## 2023-07-07 ENCOUNTER — Ambulatory Visit: Admission: RE | Admit: 2023-07-07 | Payer: Medicare Other | Source: Ambulatory Visit

## 2023-07-07 DIAGNOSIS — Z191 Hormone sensitive malignancy status: Secondary | ICD-10-CM | POA: Diagnosis not present

## 2023-07-07 DIAGNOSIS — Z510 Encounter for antineoplastic radiation therapy: Secondary | ICD-10-CM | POA: Diagnosis not present

## 2023-07-07 LAB — RAD ONC ARIA SESSION SUMMARY
Course Elapsed Days: 35
Plan Fractions Treated to Date: 26
Plan Prescribed Dose Per Fraction: 2.5 Gy
Plan Total Fractions Prescribed: 28
Plan Total Prescribed Dose: 70 Gy
Reference Point Dosage Given to Date: 65 Gy
Reference Point Session Dosage Given: 2.5 Gy
Session Number: 26

## 2023-07-08 ENCOUNTER — Ambulatory Visit
Admission: RE | Admit: 2023-07-08 | Discharge: 2023-07-08 | Disposition: A | Discharge status: Home / Self Care | Payer: Medicare Other | Source: Ambulatory Visit | Attending: Radiation Oncology | Admitting: Radiation Oncology

## 2023-07-08 ENCOUNTER — Other Ambulatory Visit: Payer: Self-pay

## 2023-07-08 DIAGNOSIS — Z191 Hormone sensitive malignancy status: Secondary | ICD-10-CM | POA: Diagnosis not present

## 2023-07-08 DIAGNOSIS — Z510 Encounter for antineoplastic radiation therapy: Secondary | ICD-10-CM | POA: Diagnosis not present

## 2023-07-08 LAB — RAD ONC ARIA SESSION SUMMARY
Course Elapsed Days: 36
Plan Fractions Treated to Date: 27
Plan Prescribed Dose Per Fraction: 2.5 Gy
Plan Total Fractions Prescribed: 28
Plan Total Prescribed Dose: 70 Gy
Reference Point Dosage Given to Date: 67.5 Gy
Reference Point Session Dosage Given: 2.5 Gy
Session Number: 27

## 2023-07-09 ENCOUNTER — Ambulatory Visit
Admission: RE | Admit: 2023-07-09 | Discharge: 2023-07-09 | Disposition: A | Discharge status: Home / Self Care | Payer: Medicare Other | Source: Ambulatory Visit | Attending: Radiation Oncology | Admitting: Radiation Oncology

## 2023-07-09 ENCOUNTER — Other Ambulatory Visit: Payer: Self-pay

## 2023-07-09 DIAGNOSIS — C61 Malignant neoplasm of prostate: Secondary | ICD-10-CM

## 2023-07-09 DIAGNOSIS — Z510 Encounter for antineoplastic radiation therapy: Secondary | ICD-10-CM | POA: Diagnosis not present

## 2023-07-09 DIAGNOSIS — Z191 Hormone sensitive malignancy status: Secondary | ICD-10-CM | POA: Diagnosis not present

## 2023-07-09 LAB — RAD ONC ARIA SESSION SUMMARY
Course Elapsed Days: 37
Plan Fractions Treated to Date: 28
Plan Prescribed Dose Per Fraction: 2.5 Gy
Plan Total Fractions Prescribed: 28
Plan Total Prescribed Dose: 70 Gy
Reference Point Dosage Given to Date: 70 Gy
Reference Point Session Dosage Given: 2.5 Gy
Session Number: 28

## 2023-07-10 NOTE — Radiation Completion Notes (Addendum)
Radiation Oncology         [phone] ________________________________  Name: [name] MRN: [id number]  Date: 07/09/2023  DOB: 05/26/1946.  Patient Name: Andrew Jarvis, Andrew Jarvis MRN: [id number] Date of Birth: 04/12/1946 Referring Physician: Jerilee Field, M.D. Date of Service: 2023-07-10 Radiation Oncologist: Margaretmary Bayley, M.D. [facility]     RADIATION ONCOLOGY END OF TREATMENT NOTE     Diagnosis: 77 y.o. gentleman with Stage T1c adenocarcinoma of the prostate with Gleason score of 3+4, and PSA of 17.2 (adjusted for finasteride).   Intent: Curative     ==========DELIVERED PLANS==========  First Treatment Date: 2023-06-02 - Last Treatment Date: 2023-07-09   Plan Name: Prostate Site: Prostate Technique: IMRT Mode: Photon Dose Per Fraction: 2.5 Gy Prescribed Dose (Delivered / Prescribed): 70 Gy / 70 Gy Prescribed Fxs (Delivered / Prescribed): 28 / 28     ==========ON TREATMENT VISIT DATES========== 2023-06-06, 2023-06-13, 2023-06-19, 2023-06-27, 2023-07-04, 2023-07-09   See weekly On Treatment Notes in Epic for details.  He tolerated the radiation treatments relatively well with increased nocturia, intermittent diarrhea/loose stools and modest fatigue.  His LUTS are tolerable on Flomax daily as prescribed.  The patient will receive a call in about one month from the radiation oncology department. He will continue follow up with his urologist, Dr. Mena Goes, as well.  ------------------------------------------------   Margaretmary Dys, MD [facility]  Radiation Oncology Direct Dial: [phone]  Fax: [phone] [email or web address]  Skype  LinkedIn

## 2023-07-18 NOTE — Progress Notes (Signed)
Patient was a RadOnc Consult on 04/01/23 for his Stage T1c adenocarcinoma of the prostate with Gleason score of 3+4, and PSA of 17.2 (adjusted for finasteride).  Patient proceed with treatment recommendations of 5.5 weeks of external beam therapy and had his final radiation treatment on 07/09/23.   Patient is scheduled for a post treatment nurse call on 09/02/23 and does not have any follow up's with urology at this time.   RN left voicemail for call back to review post treatment recommendations and review need for urology follow up.

## 2023-07-22 NOTE — Progress Notes (Signed)
Patient is scheduled for follow up with Dr. Mena Goes on 11/21 at [facility]   RN notified patient.  No additional needs at this time.

## 2023-08-21 ENCOUNTER — Other Ambulatory Visit: Payer: Self-pay | Admitting: Urology

## 2023-08-21 DIAGNOSIS — C61 Malignant neoplasm of prostate: Secondary | ICD-10-CM

## 2023-08-21 NOTE — Addendum Note (Signed)
Encounter addended by: Marcello Fennel, PA-C on: 08/21/2023 3:49 PM  Actions taken: Clinical Note Signed, Visit diagnoses modified

## 2023-08-26 ENCOUNTER — Telehealth: Payer: Self-pay | Admitting: Oncology

## 2023-08-26 NOTE — Telephone Encounter (Signed)
Pt called and lvm about his appt on 10/24 and wanting to cancel it. I called him back and asked if he would like to reschedule. He did not want to reschedule at this time but said he would call back when he was ready.

## 2023-09-02 ENCOUNTER — Ambulatory Visit
Admission: RE | Admit: 2023-09-02 | Discharge: 2023-09-02 | Disposition: A | Discharge status: Home / Self Care | Payer: Medicare Other | Source: Ambulatory Visit | Attending: Radiation Oncology | Admitting: Radiation Oncology

## 2023-09-02 DIAGNOSIS — Z510 Encounter for antineoplastic radiation therapy: Secondary | ICD-10-CM | POA: Insufficient documentation

## 2023-09-02 DIAGNOSIS — C61 Malignant neoplasm of prostate: Secondary | ICD-10-CM | POA: Insufficient documentation

## 2023-09-02 NOTE — Progress Notes (Signed)
Radiation Oncology         [phone] ________________________________  Name: Andrew Jarvis MRN: [id number]  Date of Service: 09/02/2023  DOB: 1945-12-26  Post Treatment Telephone Note  Diagnosis:  77 y.o. gentleman with Stage T1c adenocarcinoma of the prostate with Gleason score of 3+4, and PSA of 17.2 (adjusted for finasteride). (as documented in provider EOT note)  Pre Treatment IPSS Score: 7 (as documented in the provider consult note)  The patient was not available for call today. Voicemail left.  Patient has a scheduled follow up visit with his urologist, Dr. Mena Goes, on 12/2023 for ongoing surveillance. He was counseled that PSA levels will be drawn in the urology office, and was reassured that additional time is expected to improve bowel and bladder symptoms. He was encouraged to call back with concerns or questions regarding radiation.   [name], LPN

## 2023-09-03 DIAGNOSIS — H353131 Nonexudative age-related macular degeneration, bilateral, early dry stage: Secondary | ICD-10-CM | POA: Diagnosis not present

## 2023-09-04 ENCOUNTER — Inpatient Hospital Stay: Payer: Medicare Other

## 2023-09-04 ENCOUNTER — Inpatient Hospital Stay: Payer: Medicare Other | Admitting: Oncology

## 2023-09-05 ENCOUNTER — Encounter: Payer: Self-pay | Admitting: *Deleted

## 2023-09-08 ENCOUNTER — Encounter: Payer: Self-pay | Admitting: *Deleted

## 2023-09-23 ENCOUNTER — Inpatient Hospital Stay: Payer: Medicare Other | Attending: Adult Health | Admitting: *Deleted

## 2023-09-23 ENCOUNTER — Encounter: Payer: Self-pay | Admitting: *Deleted

## 2023-09-23 DIAGNOSIS — C61 Malignant neoplasm of prostate: Secondary | ICD-10-CM

## 2023-09-23 NOTE — Progress Notes (Addendum)
SCP reviewed and completed. Pt will have post -radiation follow-up at [facility] on Nov.21, 2024 w/ Dr.Eskridge. Pt refused the low dose CT scan for lung screening.

## 2023-10-02 DIAGNOSIS — R3912 Poor urinary stream: Secondary | ICD-10-CM | POA: Diagnosis not present

## 2023-10-21 DIAGNOSIS — Z23 Encounter for immunization: Secondary | ICD-10-CM | POA: Diagnosis not present

## 2023-11-26 DIAGNOSIS — Z0001 Encounter for general adult medical examination with abnormal findings: Secondary | ICD-10-CM | POA: Diagnosis not present

## 2023-11-26 DIAGNOSIS — J449 Chronic obstructive pulmonary disease, unspecified: Secondary | ICD-10-CM | POA: Diagnosis not present

## 2023-11-26 DIAGNOSIS — Z79899 Other long term (current) drug therapy: Secondary | ICD-10-CM | POA: Diagnosis not present

## 2023-11-26 DIAGNOSIS — I10 Essential (primary) hypertension: Secondary | ICD-10-CM | POA: Diagnosis not present

## 2023-11-26 DIAGNOSIS — D72829 Elevated white blood cell count, unspecified: Secondary | ICD-10-CM | POA: Diagnosis not present

## 2023-11-26 DIAGNOSIS — Z23 Encounter for immunization: Secondary | ICD-10-CM | POA: Diagnosis not present

## 2023-11-26 DIAGNOSIS — I739 Peripheral vascular disease, unspecified: Secondary | ICD-10-CM | POA: Diagnosis not present

## 2023-11-26 DIAGNOSIS — F17210 Nicotine dependence, cigarettes, uncomplicated: Secondary | ICD-10-CM | POA: Diagnosis not present

## 2023-11-26 DIAGNOSIS — E1151 Type 2 diabetes mellitus with diabetic peripheral angiopathy without gangrene: Secondary | ICD-10-CM | POA: Diagnosis not present

## 2023-11-26 DIAGNOSIS — E7800 Pure hypercholesterolemia, unspecified: Secondary | ICD-10-CM | POA: Diagnosis not present

## 2024-04-10 DIAGNOSIS — I10 Essential (primary) hypertension: Secondary | ICD-10-CM | POA: Diagnosis not present

## 2024-04-10 DIAGNOSIS — J449 Chronic obstructive pulmonary disease, unspecified: Secondary | ICD-10-CM | POA: Diagnosis not present

## 2024-04-10 DIAGNOSIS — E1151 Type 2 diabetes mellitus with diabetic peripheral angiopathy without gangrene: Secondary | ICD-10-CM | POA: Diagnosis not present

## 2024-05-10 DIAGNOSIS — J449 Chronic obstructive pulmonary disease, unspecified: Secondary | ICD-10-CM | POA: Diagnosis not present

## 2024-05-10 DIAGNOSIS — E1151 Type 2 diabetes mellitus with diabetic peripheral angiopathy without gangrene: Secondary | ICD-10-CM | POA: Diagnosis not present

## 2024-05-10 DIAGNOSIS — I10 Essential (primary) hypertension: Secondary | ICD-10-CM | POA: Diagnosis not present

## 2024-05-26 DIAGNOSIS — J449 Chronic obstructive pulmonary disease, unspecified: Secondary | ICD-10-CM | POA: Diagnosis not present

## 2024-05-26 DIAGNOSIS — I10 Essential (primary) hypertension: Secondary | ICD-10-CM | POA: Diagnosis not present

## 2024-05-26 DIAGNOSIS — Z79899 Other long term (current) drug therapy: Secondary | ICD-10-CM | POA: Diagnosis not present

## 2024-05-26 DIAGNOSIS — E7800 Pure hypercholesterolemia, unspecified: Secondary | ICD-10-CM | POA: Diagnosis not present

## 2024-05-26 DIAGNOSIS — I739 Peripheral vascular disease, unspecified: Secondary | ICD-10-CM | POA: Diagnosis not present

## 2024-05-26 DIAGNOSIS — F17210 Nicotine dependence, cigarettes, uncomplicated: Secondary | ICD-10-CM | POA: Diagnosis not present

## 2024-05-26 DIAGNOSIS — E1151 Type 2 diabetes mellitus with diabetic peripheral angiopathy without gangrene: Secondary | ICD-10-CM | POA: Diagnosis not present

## 2024-06-09 DIAGNOSIS — D485 Neoplasm of uncertain behavior of skin: Secondary | ICD-10-CM | POA: Diagnosis not present

## 2024-06-09 DIAGNOSIS — C44229 Squamous cell carcinoma of skin of left ear and external auricular canal: Secondary | ICD-10-CM | POA: Diagnosis not present

## 2024-06-10 DIAGNOSIS — J449 Chronic obstructive pulmonary disease, unspecified: Secondary | ICD-10-CM | POA: Diagnosis not present

## 2024-06-10 DIAGNOSIS — I10 Essential (primary) hypertension: Secondary | ICD-10-CM | POA: Diagnosis not present

## 2024-06-10 DIAGNOSIS — E1151 Type 2 diabetes mellitus with diabetic peripheral angiopathy without gangrene: Secondary | ICD-10-CM | POA: Diagnosis not present

## 2024-07-05 DIAGNOSIS — R3912 Poor urinary stream: Secondary | ICD-10-CM | POA: Diagnosis not present

## 2024-07-07 DIAGNOSIS — C4442 Squamous cell carcinoma of skin of scalp and neck: Secondary | ICD-10-CM | POA: Diagnosis not present

## 2024-07-11 DIAGNOSIS — I10 Essential (primary) hypertension: Secondary | ICD-10-CM | POA: Diagnosis not present

## 2024-07-11 DIAGNOSIS — E1151 Type 2 diabetes mellitus with diabetic peripheral angiopathy without gangrene: Secondary | ICD-10-CM | POA: Diagnosis not present

## 2024-07-11 DIAGNOSIS — J449 Chronic obstructive pulmonary disease, unspecified: Secondary | ICD-10-CM | POA: Diagnosis not present

## 2024-09-02 DIAGNOSIS — H524 Presbyopia: Secondary | ICD-10-CM | POA: Diagnosis not present
